# Patient Record
Sex: Female | Born: 1990 | Race: White | Hispanic: No | Smoking: Former smoker
Health system: Southern US, Community
[De-identification: ages and names within clinical notes are randomized; demographics above are authoritative.]

## PROBLEM LIST (undated history)

## (undated) DIAGNOSIS — F419 Anxiety disorder, unspecified: Secondary | ICD-10-CM

## (undated) DIAGNOSIS — D649 Anemia, unspecified: Secondary | ICD-10-CM

## (undated) DIAGNOSIS — F32A Depression, unspecified: Secondary | ICD-10-CM

## (undated) DIAGNOSIS — N809 Endometriosis, unspecified: Secondary | ICD-10-CM

## (undated) HISTORY — DX: Anemia, unspecified: D64.9

## (undated) HISTORY — DX: Anxiety disorder, unspecified: F41.9

## (undated) HISTORY — DX: Depression, unspecified: F32.A

## (undated) HISTORY — DX: Endometriosis, unspecified: N80.9

## (undated) HISTORY — PX: RIGHT OOPHORECTOMY: SHX2359

---

## 2017-05-25 HISTORY — PX: LAPAROSCOPIC HYSTERECTOMY: SHX1926

## 2019-07-07 DIAGNOSIS — N719 Inflammatory disease of uterus, unspecified: Secondary | ICD-10-CM | POA: Insufficient documentation

## 2019-07-07 DIAGNOSIS — F418 Other specified anxiety disorders: Secondary | ICD-10-CM | POA: Insufficient documentation

## 2019-07-07 DIAGNOSIS — D649 Anemia, unspecified: Secondary | ICD-10-CM | POA: Insufficient documentation

## 2019-07-07 DIAGNOSIS — N83209 Unspecified ovarian cyst, unspecified side: Secondary | ICD-10-CM | POA: Insufficient documentation

## 2019-07-07 DIAGNOSIS — Z86010 Personal history of colonic polyps: Secondary | ICD-10-CM | POA: Insufficient documentation

## 2020-09-17 DIAGNOSIS — R109 Unspecified abdominal pain: Secondary | ICD-10-CM | POA: Insufficient documentation

## 2020-09-17 DIAGNOSIS — E781 Pure hyperglyceridemia: Secondary | ICD-10-CM | POA: Insufficient documentation

## 2020-09-17 DIAGNOSIS — Z90710 Acquired absence of both cervix and uterus: Secondary | ICD-10-CM | POA: Insufficient documentation

## 2020-09-17 DIAGNOSIS — E559 Vitamin D deficiency, unspecified: Secondary | ICD-10-CM | POA: Insufficient documentation

## 2021-09-16 ENCOUNTER — Encounter: Payer: Self-pay | Admitting: Nurse Practitioner

## 2021-09-16 ENCOUNTER — Ambulatory Visit (INDEPENDENT_AMBULATORY_CARE_PROVIDER_SITE_OTHER): Payer: 59 | Admitting: Nurse Practitioner

## 2021-09-16 VITALS — BP 122/76 | HR 78 | Temp 98.2°F | Ht 66.0 in | Wt 281.8 lb

## 2021-09-16 DIAGNOSIS — D649 Anemia, unspecified: Secondary | ICD-10-CM | POA: Diagnosis not present

## 2021-09-16 DIAGNOSIS — Z131 Encounter for screening for diabetes mellitus: Secondary | ICD-10-CM

## 2021-09-16 DIAGNOSIS — Z7689 Persons encountering health services in other specified circumstances: Secondary | ICD-10-CM

## 2021-09-16 DIAGNOSIS — F4310 Post-traumatic stress disorder, unspecified: Secondary | ICD-10-CM | POA: Insufficient documentation

## 2021-09-16 DIAGNOSIS — F418 Other specified anxiety disorders: Secondary | ICD-10-CM | POA: Diagnosis not present

## 2021-09-16 DIAGNOSIS — E559 Vitamin D deficiency, unspecified: Secondary | ICD-10-CM

## 2021-09-16 DIAGNOSIS — E781 Pure hyperglyceridemia: Secondary | ICD-10-CM

## 2021-09-16 MED ORDER — BUPROPION HCL ER (XL) 300 MG PO TB24: 300.0000 mg | ORAL_TABLET | Freq: Every day | ORAL | 2 refills | Status: DC

## 2021-09-16 MED ORDER — TRAZODONE HCL 100 MG PO TABS: 100.0000 mg | ORAL_TABLET | Freq: Every day | ORAL | 2 refills | Status: DC

## 2021-09-16 MED ORDER — PRAZOSIN HCL 1 MG PO CAPS: 1.0000 mg | ORAL_CAPSULE | Freq: Every day | ORAL | 2 refills | Status: DC

## 2021-09-16 NOTE — Progress Notes (Signed)
? ?Subjective:  ? ? Patient ID: Patricia Ibarra, female    DOB: 15-Feb-1991, 31 y.o.   MRN: [id number] ? ?HPI ? ?Encounter to establish care ?Patient here to establish care.  Patient recently moved from [place]. ? ?Mixed anxiety and depressive disorder ?Patient states that she has history of anxiety and depression.  Patient currently takes Wellbutrin 300 mg for depression.  Patient states that she is doing well on it and taking it without any difficulties.  ? ?Patient does admit that her emotions have been up and down lately and wants to know if it could possibly be due to her going to menopause.  ? ?Patient denies any thoughts of hurting herself or anyone else.  Patient denies any self injury. ? ?PTSD (post-traumatic stress disorder) ?Patient states that she has history of PTSD.  Patient takes prazosin and trazodone for nightmares and sleep.  Patient states that she takes medication without difficulty. ? ?Anemia, unspecified type ?Patient states that she has history of anemia.  Patient states that some of her anemia has gotten better since her hysterectomy.  Patient states that they last 1 ovary in place. ? ?Patient states that she is currently taking iron and B12 supplements due to history of anemia. ? ?Vitamin D deficiency ?Patient admits to history of vitamin D deficiency and is taking 50,000 units of vitamin D weekly. ? ?Hypertriglyceridemia ?Patient has history of hyperlipidemia. ? ?Review of Systems  ?All other systems reviewed and are negative. ? ?   ?Objective:  ? Physical Exam ?Vitals reviewed.  ?Constitutional:   ?   General: She is not in acute distress. ?   Appearance: Normal appearance. She is obese. She is not ill-appearing, toxic-appearing or diaphoretic.  ?Cardiovascular:  ?   Rate and Rhythm: Normal rate and regular rhythm.  ?   Pulses: Normal pulses.  ?   Heart sounds: Normal heart sounds. No murmur heard. ?Pulmonary:  ?   Effort: Pulmonary effort is normal. No respiratory distress.  ?    Breath sounds: Normal breath sounds. No wheezing.  ?Musculoskeletal:  ?   Cervical back: Normal range of motion and neck supple. No rigidity or tenderness.  ?   Right lower leg: No edema.  ?   Left lower leg: No edema.  ?   Comments: Grossly intact  ?Lymphadenopathy:  ?   Cervical: No cervical adenopathy.  ?Skin: ?   General: Skin is warm.  ?   Capillary Refill: Capillary refill takes less than 2 seconds.  ?Neurological:  ?   Mental Status: She is alert.  ?   Comments: Grossly intact  ?Psychiatric:     ?   Mood and Affect: Mood normal.     ?   Behavior: Behavior normal.  ? ? ? ? ? ?   ?Assessment & Plan:  ? ?1. Encounter to establish care ?-Return to clinic in 3 months for follow-up ? ?2. Mixed anxiety and depressive disorder ?-PHQ-9 score today 12 ?GAD-7 score today is 10 ?-Patient to continue Wellbutrin as prescribed. ?-We will also assess thyroid for any abnormalities which may explain recent changes to mood ?- buPROPion (WELLBUTRIN XL) 300 MG 24 hr tablet; Take 1 tablet (300 mg total) by mouth daily.  Dispense: 30 tablet; Refill: 2 ?- TSH + free T4 ?-Ambulatory referral to psychiatry ?-Return to clinic in 3 months ? ?3. PTSD (post-traumatic stress disorder) ?-Continue taking medication as prescribed. ?- prazosin (MINIPRESS) 1 MG capsule; Take 1 capsule (1 mg total) by mouth at bedtime.  Dispense: 30 capsule; Refill: 2 ?- traZODone (DESYREL) 100 MG tablet; Take 1 tablet (100 mg total) by mouth at bedtime.  Dispense: 30 tablet; Refill: 2 ?- Ambulatory referral to Psychiatry ? ?4. Anemia, unspecified type ?-We will assess patient for the presence of anemia. ?-We will evaluate need for continued use of iron supplements and B12 supplements. ?- B12 and Folate Panel ?- CBC with Differential ?- Fe+TIBC+Fer ? ?5. Vitamin D deficiency ?-We will evaluate necessity of continued vitamin D supplementation. ?- Vitamin D (25 hydroxy) ? ?6. Hypertriglyceridemia ?- Lipid Profile ?- CMP14+EGFR ? ?7. Diabetes mellitus screening ?-  HgB A1c ? ?  ?Note:  This document was prepared using Dragon voice recognition software and may include unintentional dictation errors. ? ?

## 2021-09-17 ENCOUNTER — Telehealth: Payer: Self-pay | Admitting: Nurse Practitioner

## 2021-09-17 ENCOUNTER — Telehealth: Payer: Self-pay | Admitting: Hematology

## 2021-09-17 ENCOUNTER — Other Ambulatory Visit: Payer: Self-pay | Admitting: Nurse Practitioner

## 2021-09-17 DIAGNOSIS — D509 Iron deficiency anemia, unspecified: Secondary | ICD-10-CM

## 2021-09-17 LAB — CBC WITH DIFFERENTIAL/PLATELET
Basophils Absolute: 0 10*3/uL (ref 0.0–0.2)
Basos: 0 %
EOS (ABSOLUTE): 0.2 10*3/uL (ref 0.0–0.4)
Eos: 2 %
Hematocrit: 40.9 % (ref 34.0–46.6)
Hemoglobin: 13.1 g/dL (ref 11.1–15.9)
Immature Grans (Abs): 0 10*3/uL (ref 0.0–0.1)
Immature Granulocytes: 0 %
Lymphocytes Absolute: 2.7 10*3/uL (ref 0.7–3.1)
Lymphs: 24 %
MCH: 26.6 pg (ref 26.6–33.0)
MCHC: 32 g/dL (ref 31.5–35.7)
MCV: 83 fL (ref 79–97)
Monocytes Absolute: 0.5 10*3/uL (ref 0.1–0.9)
Monocytes: 5 %
Neutrophils Absolute: 7.8 10*3/uL — ABNORMAL HIGH (ref 1.4–7.0)
Neutrophils: 69 %
Platelets: 308 10*3/uL (ref 150–450)
RBC: 4.93 x10E6/uL (ref 3.77–5.28)
RDW: 12.4 % (ref 11.7–15.4)
WBC: 11.2 10*3/uL — ABNORMAL HIGH (ref 3.4–10.8)

## 2021-09-17 LAB — TSH+FREE T4
Free T4: 1.41 ng/dL (ref 0.82–1.77)
TSH: 1.84 u[IU]/mL (ref 0.450–4.500)

## 2021-09-17 LAB — CMP14+EGFR
ALT: 39 IU/L — ABNORMAL HIGH (ref 0–32)
AST: 20 IU/L (ref 0–40)
Albumin/Globulin Ratio: 1.9 (ref 1.2–2.2)
Albumin: 4.4 g/dL (ref 3.8–4.8)
Alkaline Phosphatase: 73 IU/L (ref 44–121)
BUN/Creatinine Ratio: 12 (ref 9–23)
BUN: 9 mg/dL (ref 6–20)
Bilirubin Total: 0.2 mg/dL (ref 0.0–1.2)
CO2: 25 mmol/L (ref 20–29)
Calcium: 9.5 mg/dL (ref 8.7–10.2)
Chloride: 104 mmol/L (ref 96–106)
Creatinine, Ser: 0.74 mg/dL (ref 0.57–1.00)
Globulin, Total: 2.3 g/dL (ref 1.5–4.5)
Glucose: 102 mg/dL — ABNORMAL HIGH (ref 70–99)
Potassium: 4.4 mmol/L (ref 3.5–5.2)
Sodium: 144 mmol/L (ref 134–144)
Total Protein: 6.7 g/dL (ref 6.0–8.5)
eGFR: 110 mL/min/{1.73_m2} (ref >59)

## 2021-09-17 LAB — IRON,TIBC AND FERRITIN PANEL
Ferritin: 114 ng/mL (ref 15–150)
Iron Saturation: 9 % — CL (ref 15–55)
Iron: 28 ug/dL (ref 27–159)
Total Iron Binding Capacity: 302 ug/dL (ref 250–450)
UIBC: 274 ug/dL (ref 131–425)

## 2021-09-17 LAB — HEMOGLOBIN A1C
Est. average glucose Bld gHb Est-mCnc: 117 mg/dL
Hgb A1c MFr Bld: 5.7 % — ABNORMAL HIGH (ref 4.8–5.6)

## 2021-09-17 LAB — LIPID PANEL
Chol/HDL Ratio: 3.8 ratio (ref 0.0–4.4)
Cholesterol, Total: 134 mg/dL (ref 100–199)
HDL: 35 mg/dL — ABNORMAL LOW (ref >39)
LDL Chol Calc (NIH): 80 mg/dL (ref 0–99)
Triglycerides: 99 mg/dL (ref 0–149)
VLDL Cholesterol Cal: 19 mg/dL (ref 5–40)

## 2021-09-17 LAB — B12 AND FOLATE PANEL
Folate: 9.6 ng/mL (ref >3.0)
Vitamin B-12: 346 pg/mL (ref 232–1245)

## 2021-09-17 LAB — VITAMIN D 25 HYDROXY (VIT D DEFICIENCY, FRACTURES): Vit D, 25-Hydroxy: 34.4 ng/mL (ref 30.0–100.0)

## 2021-09-17 NOTE — Telephone Encounter (Signed)
Amy from [facility] calling to let us know that they do not have any openings until May 26. Amy states that she will fix the referral for patient to go to [place]. ?

## 2021-09-17 NOTE — Telephone Encounter (Signed)
Scheduled appt per 4/26 referral. I did offer pt an earlier date, however pt was unable to take it due to her schedule. She was scheduled for next appt that worked for her. Pt is aware of appt date and time. Pt is aware to arrive 15 mins prior to appt time and to bring and updated insurance card. Pt is aware of appt location.   ?

## 2021-09-18 ENCOUNTER — Other Ambulatory Visit: Payer: Self-pay | Admitting: Nurse Practitioner

## 2021-09-18 DIAGNOSIS — D509 Iron deficiency anemia, unspecified: Secondary | ICD-10-CM

## 2021-09-18 MED ORDER — IRON (FERROUS SULFATE) 325 (65 FE) MG PO TABS: 325.0000 mg | ORAL_TABLET | Freq: Every day | ORAL | 1 refills | Status: DC

## 2021-09-23 ENCOUNTER — Ambulatory Visit
Admission: EM | Admit: 2021-09-23 | Discharge: 2021-09-23 | Disposition: A | Discharge status: Home / Self Care | Payer: 59 | Attending: Family Medicine | Admitting: Family Medicine

## 2021-09-23 ENCOUNTER — Ambulatory Visit (INDEPENDENT_AMBULATORY_CARE_PROVIDER_SITE_OTHER): Payer: 59

## 2021-09-23 ENCOUNTER — Encounter: Payer: Self-pay | Admitting: Emergency Medicine

## 2021-09-23 DIAGNOSIS — M25531 Pain in right wrist: Secondary | ICD-10-CM

## 2021-09-23 MED ORDER — IBUPROFEN 800 MG PO TABS
800.0000 mg | ORAL_TABLET | Freq: Once | ORAL | Status: AC
  Administered 2021-09-23: 800 mg via ORAL

## 2021-09-23 NOTE — ED Provider Notes (Signed)
?[facility] ? ? ? ?CSN: [id number] ?Arrival date & time: 09/23/21  [id number] ? ? ?  ? ?History   ?Chief Complaint ?No chief complaint on file. ? ? ?HPI ?Patricia Ibarra is a 31 y.o. female.  ? ?Presenting today with new onset right wrist pain extending to the base of the right hand after falling and landing on the wrist.  She states it bent the wrist onto itself when she fell.  Has very painful range of motion though able to move in all directions, numbness and tingling into her middle 3 fingers on the right hand and swelling starting to form.  She states that this just happened this morning.  Has not yet taken anything for pain.  No past history of orthopedic injury to the area. ? ? ?History reviewed. No pertinent past medical history. ? ?Patient Active Problem List  ? Diagnosis Date Noted  ? PTSD (post-traumatic stress disorder) 09/16/2021  ? Morbid obesity (HCC) 09/16/2021  ? Abdominal pain 09/17/2020  ? History of total hysterectomy 09/17/2020  ? Hypertriglyceridemia 09/17/2020  ? Vitamin D deficiency 09/17/2020  ? History of adenomatous polyp of colon 07/07/2019  ? Anemia 07/07/2019  ? Cyst of ovary 07/07/2019  ? Endometritis 07/07/2019  ? Mixed anxiety and depressive disorder 07/07/2019  ? ? ?History reviewed. No pertinent surgical history. ? ?OB History   ?No obstetric history on file. ?  ? ? ? ?Home Medications   ? ?Prior to Admission medications   ?Medication Sig Start Date End Date Taking? Authorizing Provider  ?B Complex-C (B-COMPLEX WITH VITAMIN C) tablet Take 1 tablet by mouth daily.    [provider]  ?buPROPion (WELLBUTRIN XL) 300 MG 24 hr tablet Take 1 tablet (300 mg total) by mouth daily. 09/16/21   Ameduite, Alvino ChapelLeonna S, NP  ?Iron, Ferrous Sulfate, 325 (65 Fe) MG TABS Take 325 mg by mouth daily. 09/18/21   Ameduite, Alvino ChapelLeonna S, NP  ?prazosin (MINIPRESS) 1 MG capsule Take 1 capsule (1 mg total) by mouth at bedtime. 09/16/21   Ameduite, Alvino ChapelLeonna S, NP  ?traZODone (DESYREL) 100 MG tablet Take 1  tablet (100 mg total) by mouth at bedtime. 09/16/21   Ameduite, Alvino ChapelLeonna S, NP  ?Vitamin D, Ergocalciferol, (DRISDOL) 1.25 MG (50000 UNIT) CAPS capsule Take 50,000 Units by mouth every 7 (seven) days.    [provider]  ? ? ?Family History ?History reviewed. No pertinent family history. ? ?Social History ?Social History  ? ?Tobacco Use  ? Smoking status: Former  ?  Packs/day: 0.25  ?  Types: Cigarettes  ?  Quit date: 2022  ?  Years since quitting: 1.3  ?Substance Use Topics  ? Alcohol use: Never  ? Drug use: Never  ? ? ? ?Allergies   ?Morphine ? ? ?Review of Systems ?Review of Systems ?Per HPI ? ?Physical Exam ?Triage Vital Signs ?ED Triage Vitals  ?Enc Vitals Group  ?   BP 09/23/21 0938 117/85  ?   Pulse Rate 09/23/21 0938 88  ?   Resp 09/23/21 0938 18  ?   Temp 09/23/21 0938 98.5 ?F (36.9 ?C)  ?   Temp Source 09/23/21 0938 Oral  ?   SpO2 09/23/21 0938 97 %  ?   Weight --   ?   Height --   ?   Head Circumference --   ?   Peak Flow --   ?   Pain Score 09/23/21 0939 8  ?   Pain Loc --   ?  Pain Edu? --   ?   Excl. in GC? --   ? ?No data found. ? ?Updated Vital Signs ?BP 117/85 (BP Location: Right Arm)   Pulse 88   Temp 98.5 ?F (36.9 ?C) (Oral)   Resp 18   SpO2 97%  ? ?Visual Acuity ?Right Eye Distance:   ?Left Eye Distance:   ?Bilateral Distance:   ? ?Right Eye Near:   ?Left Eye Near:    ?Bilateral Near:    ? ?Physical Exam ?Vitals and nursing note reviewed.  ?Constitutional:   ?   Appearance: Normal appearance. She is not ill-appearing.  ?HENT:  ?   Head: Atraumatic.  ?   Mouth/Throat:  ?   Mouth: Mucous membranes are moist.  ?Eyes:  ?   Extraocular Movements: Extraocular movements intact.  ?   Conjunctiva/sclera: Conjunctivae normal.  ?Cardiovascular:  ?   Rate and Rhythm: Normal rate and regular rhythm.  ?   Heart sounds: Normal heart sounds.  ?Pulmonary:  ?   Effort: Pulmonary effort is normal.  ?   Breath sounds: Normal breath sounds.  ?Musculoskeletal:     ?   General: Swelling, tenderness and signs  of injury present. No deformity. Normal range of motion.  ?   Cervical back: Normal range of motion and neck supple.  ?   Comments: Range of motion guarded but intact right wrist and hand.  Diffuse tenderness to palpation right dorsal wrist into proximal hand.  Trace edema in this area.  No bony deformity palpable  ?Skin: ?   General: Skin is warm and dry.  ?   Findings: No bruising or erythema.  ?Neurological:  ?   Mental Status: She is alert and oriented to person, place, and time.  ?   Motor: No weakness.  ?   Gait: Gait normal.  ?   Comments: Right hand neurovascularly intact  ?Psychiatric:     ?   Mood and Affect: Mood normal.     ?   Thought Content: Thought content normal.     ?   Judgment: Judgment normal.  ? ? ? ?UC Treatments / Results  ?Labs ?(all labs ordered are listed, but only abnormal results are displayed) ?Labs Reviewed - No data to display ? ?EKG ? ? ?Radiology ?DG Wrist Complete Right ? ?Result Date: 09/23/2021 ?CLINICAL DATA:  Injured wrist this morning.  Pain. EXAM: RIGHT WRIST - COMPLETE 3+ VIEW COMPARISON:  None FINDINGS: The joint spaces are maintained. No acute fracture. IMPRESSION: No acute bony findings. Electronically Signed   By: Rudie Meyer M.D.   On: 09/23/2021 09:57   ? ?Procedures ?Procedures (including critical care time) ? ?Medications Ordered in UC ?Medications  ?ibuprofen (ADVIL) tablet 800 mg (800 mg Oral Given 09/23/21 0946)  ? ? ?Initial Impression / Assessment and Plan / UC Course  ?I have reviewed the triage vital signs and the nursing notes. ? ?Pertinent labs & imaging results that were available during my care of the patient were reviewed by me and considered in my medical decision making (see chart for details). ? ?  ? ? ?X-ray of the right wrist negative for acute bony abnormality, suspect strain from the fall.  Will place in a wrist brace for comfort, discussed RICE protocol, over-the-counter pain relievers.  Ibuprofen given in clinic per her request.  Return for any  acutely worsening symptoms. ? ?Final Clinical Impressions(s) / UC Diagnoses  ? ?Final diagnoses:  ?Right wrist pain  ? ?Discharge Instructions   ?  None ?  ? ?ED Prescriptions   ?None ?  ? ?PDMP not reviewed this encounter. ?  ?Particia Nearing, PA-C ?09/23/21 1024 ? ?

## 2021-09-23 NOTE — ED Triage Notes (Signed)
Rolled over right arm and wrist was bend inward this morning.   ?

## 2021-10-06 ENCOUNTER — Other Ambulatory Visit: Payer: Self-pay

## 2021-10-06 ENCOUNTER — Inpatient Hospital Stay: Payer: 59 | Attending: Hematology | Admitting: Hematology

## 2021-10-06 ENCOUNTER — Inpatient Hospital Stay: Payer: 59

## 2021-10-06 VITALS — BP 119/68 | HR 77 | Temp 97.7°F | Resp 20 | Wt 273.8 lb

## 2021-10-06 DIAGNOSIS — Z87891 Personal history of nicotine dependence: Secondary | ICD-10-CM | POA: Diagnosis not present

## 2021-10-06 DIAGNOSIS — D509 Iron deficiency anemia, unspecified: Secondary | ICD-10-CM

## 2021-10-06 DIAGNOSIS — Z79899 Other long term (current) drug therapy: Secondary | ICD-10-CM | POA: Insufficient documentation

## 2021-10-06 DIAGNOSIS — N809 Endometriosis, unspecified: Secondary | ICD-10-CM | POA: Insufficient documentation

## 2021-10-06 DIAGNOSIS — R5383 Other fatigue: Secondary | ICD-10-CM | POA: Insufficient documentation

## 2021-10-06 DIAGNOSIS — K580 Irritable bowel syndrome with diarrhea: Secondary | ICD-10-CM | POA: Diagnosis not present

## 2021-10-06 DIAGNOSIS — F32A Depression, unspecified: Secondary | ICD-10-CM | POA: Insufficient documentation

## 2021-10-06 DIAGNOSIS — R519 Headache, unspecified: Secondary | ICD-10-CM | POA: Insufficient documentation

## 2021-10-06 LAB — CBC WITH DIFFERENTIAL/PLATELET
Abs Immature Granulocytes: 0.05 10*3/uL (ref 0.00–0.07)
Basophils Absolute: 0 10*3/uL (ref 0.0–0.1)
Basophils Relative: 0 %
Eosinophils Absolute: 0.3 10*3/uL (ref 0.0–0.5)
Eosinophils Relative: 3 %
HCT: 38.8 % (ref 36.0–46.0)
Hemoglobin: 12.8 g/dL (ref 12.0–15.0)
Immature Granulocytes: 1 %
Lymphocytes Relative: 26 %
Lymphs Abs: 2.7 10*3/uL (ref 0.7–4.0)
MCH: 27 pg (ref 26.0–34.0)
MCHC: 33 g/dL (ref 30.0–36.0)
MCV: 81.9 fL (ref 80.0–100.0)
Monocytes Absolute: 0.5 10*3/uL (ref 0.1–1.0)
Monocytes Relative: 5 %
Neutro Abs: 6.9 10*3/uL (ref 1.7–7.7)
Neutrophils Relative %: 65 %
Platelets: 268 10*3/uL (ref 150–400)
RBC: 4.74 MIL/uL (ref 3.87–5.11)
RDW: 12.7 % (ref 11.5–15.5)
WBC: 10.3 10*3/uL (ref 4.0–10.5)
nRBC: 0 % (ref 0.0–0.2)

## 2021-10-06 LAB — SEDIMENTATION RATE: Sed Rate: 12 mm/hr (ref 0–22)

## 2021-10-06 LAB — CMP (CANCER CENTER ONLY)
ALT: 43 U/L (ref 0–44)
AST: 21 U/L (ref 15–41)
Albumin: 4.4 g/dL (ref 3.5–5.0)
Alkaline Phosphatase: 54 U/L (ref 38–126)
Anion gap: 7 (ref 5–15)
BUN: 10 mg/dL (ref 6–20)
CO2: 27 mmol/L (ref 22–32)
Calcium: 9.3 mg/dL (ref 8.9–10.3)
Chloride: 103 mmol/L (ref 98–111)
Creatinine: 0.66 mg/dL (ref 0.44–1.00)
GFR, Estimated: >60 mL/min (ref >60)
Glucose, Bld: 98 mg/dL (ref 70–99)
Potassium: 4 mmol/L (ref 3.5–5.1)
Sodium: 137 mmol/L (ref 135–145)
Total Bilirubin: 0.5 mg/dL (ref 0.3–1.2)
Total Protein: 7.2 g/dL (ref 6.5–8.1)

## 2021-10-06 LAB — IRON AND IRON BINDING CAPACITY (CC-WL,HP ONLY)
Iron: 43 ug/dL (ref 28–170)
Saturation Ratios: 13 % (ref 10.4–31.8)
TIBC: 343 ug/dL (ref 250–450)
UIBC: 300 ug/dL (ref 148–442)

## 2021-10-06 NOTE — Progress Notes (Signed)
HEMATOLOGY/ONCOLOGY CONSULTATION NOTE  Date of Service: 10/06/2021  Patient Care Team: Ameduite, Alvino ChapelLeonna S, NP as PCP - General (Nurse Practitioner)  CHIEF COMPLAINTS/PURPOSE OF CONSULTATION:  Evaluation and management of iron deficiency anemia.  HISTORY OF PRESENTING ILLNESS:   Patricia Ibarra is a wonderful 31 y.o. female who has been referred to us by Dr Thayer JewAmeduite, Alvino ChapelLeonna S, NP for evaluation and management of iron deficiency. She reports She is doing well.  She notes that she has dealt with this previously due to heavier periods. She reports having endometriosis.   She takes vitamin B12 and vitamin D.  She notes having some fatigue.  She notes diarrhea dominant IBS.  She reports some recent night sweats. She notes that she feels some of her symptoms are attributed to depression or her weight.  She notes some headaches that radiate from her neck to her head.  We discussed that based on her labs that IV iron would not be advisable. She notes that she tolerates the oral iron well and we discussed continuing oral iron. We discussed getting labs today for further evaluation which she was agreeable to.  She reports persistent fatigue. She endorses Wellbutrin. She reports that the oral iron does not improve her fatigue much.   No black or bloody stools. No other new or acute focal symptoms.  Labs done 09/16/2021 were reviewed in detail.  CBC WNL except slightly elevated ANC of 7.8 and WBC count of 11.2 Ferritin of 114. Iron Saturation of 9% CMP unremarkable.  MEDICAL HISTORY:  H/o depression with previous suicidal ideation needing hospitalization.  SURGICAL HISTORY: cholecystectomy  SOCIAL HISTORY: Social History   Socioeconomic History   Marital status: Married    Spouse name: Not on file   Number of children: Not on file   Years of education: Not on file   Highest education level: Not on file  Occupational History   Not on file  Tobacco Use   Smoking  status: Former    Packs/day: 0.25    Types: Cigarettes    Quit date: 2022    Years since quitting: 1.3   Smokeless tobacco: Not on file  Substance and Sexual Activity   Alcohol use: Never   Drug use: Never   Sexual activity: Not on file  Other Topics Concern   Not on file  Social History Narrative   Not on file   Social Determinants of Health   Financial Resource Strain: Not on file  Food Insecurity: Not on file  Transportation Needs: Not on file  Physical Activity: Not on file  Stress: Not on file  Social Connections: Not on file  Intimate Partner Violence: Not on file    FAMILY HISTORY: No family history on file.  ALLERGIES:  is allergic to morphine.  MEDICATIONS:  Current Outpatient Medications  Medication Sig Dispense Refill   B Complex-C (B-COMPLEX WITH VITAMIN C) tablet Take 1 tablet by mouth daily.     buPROPion (WELLBUTRIN XL) 300 MG 24 hr tablet Take 1 tablet (300 mg total) by mouth daily. 30 tablet 2   Iron, Ferrous Sulfate, 325 (65 Fe) MG TABS Take 325 mg by mouth daily. 90 tablet 1   prazosin (MINIPRESS) 1 MG capsule Take 1 capsule (1 mg total) by mouth at bedtime. 30 capsule 2   traZODone (DESYREL) 100 MG tablet Take 1 tablet (100 mg total) by mouth at bedtime. 30 tablet 2   Vitamin D, Ergocalciferol, (DRISDOL) 1.25 MG (50000 UNIT) CAPS capsule Take 50,000 Units  by mouth every 7 (seven) days.     No current facility-administered medications for this visit.    REVIEW OF SYSTEMS:    10 Point review of Systems was done is negative except as noted above.  PHYSICAL EXAMINATION: ECOG PERFORMANCE STATUS: 0 - Asymptomatic  . Vitals:   10/06/21 1430  BP: 119/68  Pulse: 77  Resp: 20  Temp: 97.7 F (36.5 C)  SpO2: 99%   Filed Weights   10/06/21 1430  Weight: 273 lb 12.8 oz (124.2 kg)   .There is no height or weight on file to calculate BMI. NAD GENERAL:alert, in no acute distress and comfortable SKIN: no acute rashes, no significant lesions EYES:  conjunctiva are pink and non-injected, sclera anicteric NECK: supple, no JVD LYMPH:  no palpable lymphadenopathy in the cervical, axillary or inguinal regions LUNGS: clear to auscultation b/l with normal respiratory effort HEART: regular rate & rhythm ABDOMEN:  normoactive bowel sounds , non tender, not distended. Extremity: no pedal edema PSYCH: alert & oriented x 3 with fluent speech NEURO: no focal motor/sensory deficits  LABORATORY DATA:  I have reviewed the data as listed .    Latest Ref Rng & Units 10/06/2021    3:01 PM 09/16/2021    9:26 AM  CBC  WBC 4.0 - 10.5 K/uL 10.3   11.2    Hemoglobin 12.0 - 15.0 g/dL 67.3   41.9    Hematocrit 36.0 - 46.0 % 38.8   40.9    Platelets 150 - 400 K/uL 268   308     .    Latest Ref Rng & Units 10/06/2021    3:01 PM 09/16/2021    9:26 AM  CMP  Glucose 70 - 99 mg/dL 98   379    BUN 6 - 20 mg/dL 10   9    Creatinine 0.24 - 1.00 mg/dL 0.97   3.53    Sodium 299 - 145 mmol/L 137   144    Potassium 3.5 - 5.1 mmol/L 4.0   4.4    Chloride 98 - 111 mmol/L 103   104    CO2 22 - 32 mmol/L 27   25    Calcium 8.9 - 10.3 mg/dL 9.3   9.5    Total Protein 6.5 - 8.1 g/dL 7.2   6.7    Total Bilirubin 0.3 - 1.2 mg/dL 0.5   0.2    Alkaline Phos 38 - 126 U/L 54   73    AST 15 - 41 U/L 21   20    ALT 0 - 44 U/L 43   39     . Lab Results  Component Value Date   IRON 43 10/06/2021   TIBC 343 10/06/2021   IRONPCTSAT 13 10/06/2021   (Iron and TIBC)  Lab Results  Component Value Date   FERRITIN 73 10/06/2021        RADIOGRAPHIC STUDIES: I have personally reviewed the radiological images as listed and agreed with the findings in the report. DG Wrist Complete Right  Result Date: 09/23/2021 CLINICAL DATA:  Injured wrist this morning.  Pain. EXAM: RIGHT WRIST - COMPLETE 3+ VIEW COMPARISON:  None FINDINGS: The joint spaces are maintained. No acute fracture. IMPRESSION: No acute bony findings. Electronically Signed   By: Rudie Meyer M.D.   On:  09/23/2021 09:57     ASSESSMENT & PLAN:   31 y.o. very pleasant female with   Iron deficiency anemia Labs done 09/16/2021 were reviewed in detail.  Ferritin of 114. Iron Saturation of 9%  Plan -I discussed available labs with the patient in details -Patient's chart reviewed in detail -Continue 1x Ferrous Sulfate 325 mg tablet p.o daily and if tolerated increase to 1 tab po bid. Alternatively could use Iron polysaccharide 150mg  po daily OTC. -Schedule labs today. -Return to clinic as needed. (F/u with PCP in 3 months for rpt labs and adjust po iron to optimize iron levels ferritin>50% and iron sat>20%). - if patient continue to get anemia -- plz reconsult for consideration of IV iron replacement. Follow up: Labs today RTC with Dr as needed    .The total time spent in the appointment was 35 minutes* .  All of the patient's questions were answered with apparent satisfaction. The patient knows to call the clinic with any problems, questions or concerns.   Candise Che MD MS AAHIVMS [facility] Hematology/Oncology Physician [facility]  .*Total Encounter Time as defined by the Centers for Medicare and Medicaid Services includes, in addition to the face-to-face time of a patient visit (documented in the note above) non-face-to-face time: obtaining and reviewing outside history, ordering and reviewing medications, tests or procedures, care coordination (communications with other health care professionals or caregivers) and documentation in the medical record.  I, [facility], am acting as scribe for Bernestine Amass, MD. .I have reviewed the above documentation for accuracy and completeness, and I agree with the above. Sheron Nightingale MD

## 2021-10-07 LAB — FERRITIN: Ferritin: 73 ng/mL (ref 11–307)

## 2021-10-08 ENCOUNTER — Other Ambulatory Visit: Payer: Self-pay

## 2021-10-08 ENCOUNTER — Other Ambulatory Visit: Payer: Self-pay | Admitting: Nurse Practitioner

## 2021-10-08 ENCOUNTER — Encounter: Payer: Self-pay | Admitting: Nurse Practitioner

## 2021-10-08 DIAGNOSIS — E559 Vitamin D deficiency, unspecified: Secondary | ICD-10-CM

## 2021-10-08 MED ORDER — VITAMIN D (ERGOCALCIFEROL) 1.25 MG (50000 UNIT) PO CAPS
50000.0000 [IU] | ORAL_CAPSULE | ORAL | 0 refills | Status: DC
Start: 2021-10-08 — End: 2022-08-13
  Filled 2021-10-08: qty 6, 42d supply, fill #0

## 2021-10-14 ENCOUNTER — Other Ambulatory Visit: Payer: Self-pay

## 2021-10-14 ENCOUNTER — Ambulatory Visit
Admission: EM | Admit: 2021-10-14 | Discharge: 2021-10-14 | Disposition: A | Discharge status: Home / Self Care | Payer: 59 | Attending: Nurse Practitioner | Admitting: Nurse Practitioner

## 2021-10-14 ENCOUNTER — Encounter: Payer: Self-pay | Admitting: Emergency Medicine

## 2021-10-14 DIAGNOSIS — S80862A Insect bite (nonvenomous), left lower leg, initial encounter: Secondary | ICD-10-CM

## 2021-10-14 DIAGNOSIS — W57XXXA Bitten or stung by nonvenomous insect and other nonvenomous arthropods, initial encounter: Secondary | ICD-10-CM

## 2021-10-14 MED ORDER — DOXYCYCLINE HYCLATE 100 MG PO TABS: 200.0000 mg | ORAL_TABLET | Freq: Once | ORAL | 0 refills | Status: AC

## 2021-10-14 NOTE — ED Provider Notes (Signed)
[facility]    CSN: [id number] Arrival date & time: 10/14/21  0805      History   Chief Complaint Chief Complaint  Patient presents with   Tick Removal    HPI Patricia Ibarra is a 31 y.o. female.   The patient is a 31 year old female who presents after she removed a tick last evening.  Tick was attached to her left lower extremity.  She states [place] and she noticed the tick last evening.  Patient does have the tick present with her.  The tick looks intact.  The patient denies fever, chills, generalized malaise, joint pain, or headache.  She states that she has placed an area around the back of her leg where the tick was to ensure the area has not spread or gotten larger.  The history is provided by the patient.   History reviewed. No pertinent past medical history.  Patient Active Problem List   Diagnosis Date Noted   PTSD (post-traumatic stress disorder) 09/16/2021   Morbid obesity (HCC) 09/16/2021   Abdominal pain 09/17/2020   History of total hysterectomy 09/17/2020   Hypertriglyceridemia 09/17/2020   Vitamin D deficiency 09/17/2020   History of adenomatous polyp of colon 07/07/2019   Anemia 07/07/2019   Cyst of ovary 07/07/2019   Endometritis 07/07/2019   Mixed anxiety and depressive disorder 07/07/2019    History reviewed. No pertinent surgical history.  OB History   No obstetric history on file.      Home Medications    Prior to Admission medications   Medication Sig Start Date End Date Taking? Authorizing Provider  doxycycline (VIBRA-TABS) 100 MG tablet Take 2 tablets (200 mg total) by mouth once for 1 dose. 10/14/21 10/14/21 Yes Granvel Proudfoot-Warren, Sadie Haberhristie J, NP  B Complex-C (B-COMPLEX WITH VITAMIN C) tablet Take 1 tablet by mouth daily.    [provider]  buPROPion (WELLBUTRIN XL) 300 MG 24 hr tablet Take 1 tablet (300 mg total) by mouth daily. 09/16/21   [facility], Alvino ChapelLeonna S, NP  Iron,  Ferrous Sulfate, 325 (65 Fe) MG TABS Take 325 mg by mouth daily. 09/18/21   [facility], Alvino ChapelLeonna S, NP  prazosin (MINIPRESS) 1 MG capsule Take 1 capsule (1 mg total) by mouth at bedtime. 09/16/21   [facility], Alvino ChapelLeonna S, NP  traZODone (DESYREL) 100 MG tablet Take 1 tablet (100 mg total) by mouth at bedtime. 09/16/21   [facility], Alvino ChapelLeonna S, NP  Vitamin D, Ergocalciferol, (DRISDOL) 1.25 MG (50000 UNIT) CAPS capsule Take 1 capsule (50,000 Units total) by mouth every 7 (seven) days. 10/08/21   [facility], Alvino ChapelLeonna S, NP    Family History History reviewed. No pertinent family history.  Social History Social History   Tobacco Use   Smoking status: Former    Packs/day: 0.25    Types: Cigarettes    Quit date: 2022    Years since quitting: 1.3  Substance Use Topics   Alcohol use: Never   Drug use: Never     Allergies   Morphine   Review of Systems Review of Systems PER HPI  Physical Exam Triage Vital Signs ED Triage Vitals  Enc Vitals Group     BP 10/14/21 0819 117/78     Pulse Rate 10/14/21 0819 84     Resp 10/14/21 0819 20     Temp 10/14/21 0819 97.7 F (36.5 C)     Temp Source 10/14/21 0819 Oral     SpO2 10/14/21 0819  97 %     Weight 10/14/21 0822 275 lb (124.7 kg)     Height 10/14/21 0822 5\' 7"  (1.702 m)     Head Circumference --      Peak Flow --      Pain Score 10/14/21 0822 3     Pain Loc --      Pain Edu? --      Excl. in GC? --    No data found.  Updated Vital Signs BP 117/78 (BP Location: Right Arm)   Pulse 84   Temp 97.7 F (36.5 C) (Oral)   Resp 20   Ht 5\' 7"  (1.702 m)   Wt 275 lb (124.7 kg)   SpO2 97%   BMI 43.07 kg/m   Visual Acuity Right Eye Distance:   Left Eye Distance:   Bilateral Distance:    Right Eye Near:   Left Eye Near:    Bilateral Near:     Physical Exam Vitals reviewed.  Constitutional:      Appearance: Normal appearance.  HENT:     Head: Normocephalic and atraumatic.  Cardiovascular:     Rate and Rhythm: Normal rate and regular  rhythm.     Pulses: Normal pulses.     Heart sounds: Normal heart sounds.  Pulmonary:     Effort: Pulmonary effort is normal.     Breath sounds: Normal breath sounds.  Abdominal:     General: Bowel sounds are normal.     Palpations: Abdomen is soft.  Skin:    General: Skin is warm and dry.     Capillary Refill: Capillary refill takes less than 2 seconds.     Findings: No bruising, erythema, rash or wound. Rash is not urticarial.  Neurological:     General: No focal deficit present.     Mental Status: She is alert and oriented to person, place, and time.  Psychiatric:        Mood and Affect: Mood normal.        Behavior: Behavior normal.     UC Treatments / Results  Labs (all labs ordered are listed, but only abnormal results are displayed) Labs Reviewed - No data to display  EKG   Radiology No results found.  Procedures Procedures (including critical care time)  Medications Ordered in UC Medications - No data to display  Initial Impression / Assessment and Plan / UC Course  I have reviewed the triage vital signs and the nursing notes.  Pertinent labs & imaging results that were available during my care of the patient were reviewed by me and considered in my medical decision making (see chart for details).  The patient is a 31 year old female who presents after a tick bite to the right lower leg.  Patient states tick was removed approximately 1 day ago. Patient expects tick was [place] for [place]  Patient does have the tick present with her which is intact.  The area on her left lower extremity is intact, there is no rash, redness, or induration where the tick was present.  We will provide the patient with a prescription for doxycycline 200 mg one-time dose.  Discussion with the patient regarding erythema migrans, was able to pull up the pictures on her cell phone to show her what to look for for the next 1 to 2 months.  Strict return precautions  were provided of when to return to our clinic versus when to go to the ER.  Follow-up  as needed. Final Clinical Impressions(s) / UC Diagnoses   Final diagnoses:  Tick bite of left lower leg, initial encounter     Discharge Instructions      Take medication as prescribed. As discussed, continue to monitor the area for a bull's-eye appearing rash.  You should continue to monitor the area for the next 1 to 2 months. You may continue to apply Neosporin to the area as needed. Follow-up in the ED immediately if he develops fever, chills, generalized fatigue, or other concerns. Follow-up as needed.     ED Prescriptions     Medication Sig Dispense Auth. Provider   doxycycline (VIBRA-TABS) 100 MG tablet Take 2 tablets (200 mg total) by mouth once for 1 dose. 2 tablet Petronella Shuford-Warren, Sadie Haber, NP      PDMP not reviewed this encounter.   Abran Cantor, NP 10/14/21 [phone]

## 2021-10-14 NOTE — Discharge Instructions (Addendum)
Take medication as prescribed. As discussed, continue to monitor the area for a bull's-eye appearing rash.  You should continue to monitor the area for the next 1 to 2 months. You may continue to apply Neosporin to the area as needed. Follow-up in the ED immediately if he develops fever, chills, generalized fatigue, or other concerns. Follow-up as needed. 

## 2021-10-14 NOTE — ED Triage Notes (Addendum)
Pt reports removed tick yesterday from left posterior thigh and wants to make sure "also removed head." Pt reports drew around site with a red and black marker to make sure "redness wasn't spreading overtime". Pt reports tenderness to left thigh.

## 2021-11-03 ENCOUNTER — Ambulatory Visit: Payer: 59 | Admitting: Nurse Practitioner

## 2021-12-22 ENCOUNTER — Ambulatory Visit (INDEPENDENT_AMBULATORY_CARE_PROVIDER_SITE_OTHER): Payer: 59 | Admitting: Nurse Practitioner

## 2021-12-22 ENCOUNTER — Encounter: Payer: Self-pay | Admitting: Nurse Practitioner

## 2021-12-22 VITALS — BP 109/76 | HR 65 | Ht 66.0 in | Wt 268.6 lb

## 2021-12-22 DIAGNOSIS — F418 Other specified anxiety disorders: Secondary | ICD-10-CM | POA: Diagnosis not present

## 2021-12-22 DIAGNOSIS — F4310 Post-traumatic stress disorder, unspecified: Secondary | ICD-10-CM

## 2021-12-22 DIAGNOSIS — J302 Other seasonal allergic rhinitis: Secondary | ICD-10-CM | POA: Diagnosis not present

## 2021-12-22 DIAGNOSIS — D509 Iron deficiency anemia, unspecified: Secondary | ICD-10-CM

## 2021-12-22 MED ORDER — CETIRIZINE HCL 10 MG PO TABS: 10.0000 mg | ORAL_TABLET | Freq: Every day | ORAL | 5 refills | Status: DC

## 2021-12-22 MED ORDER — BUPROPION HCL ER (XL) 300 MG PO TB24: 300.0000 mg | ORAL_TABLET | Freq: Every day | ORAL | 5 refills | Status: DC

## 2021-12-22 MED ORDER — IRON (FERROUS SULFATE) 325 (65 FE) MG PO TABS: 325.0000 mg | ORAL_TABLET | Freq: Every day | ORAL | 1 refills | Status: DC

## 2021-12-22 MED ORDER — PRAZOSIN HCL 1 MG PO CAPS: 1.0000 mg | ORAL_CAPSULE | Freq: Every day | ORAL | 5 refills | Status: DC

## 2021-12-22 MED ORDER — TRAZODONE HCL 100 MG PO TABS: 100.0000 mg | ORAL_TABLET | Freq: Every day | ORAL | 5 refills | Status: DC

## 2021-12-22 NOTE — Progress Notes (Signed)
Subjective:    Patient ID: Patricia Ibarra, female    DOB: [birth year], 31 y.o.   MRN: [id number]  HPI  Patient arrives for a follow up on depression and anxiety. Patient states she has no problems or concerns.  Patient was seen by hematology for low iron saturation.  Patient states that she was instructed to continue taking p.o. iron.  Patient states that depression and anxiety has been well controlled with Wellbutrin 300 mg.  Patient would like to continue.  Patient denies any thoughts of wanting to hurt self or anyone else.  Patient continues to take prazosin and trazodone for PTSD.  Patient states that symptoms are currently stable on her prescribed medications.  Patient states that she had multiple untreated ear infections when she was little.  Patient states that her left ear has been bothering her for the past couple weeks.  Patient states that it feels like she is constantly listening to an seashell.  Patient would like her left ear to be evaluated today.  Patient states that she is very excited about her upcoming job working in [employer].  Patient states that she will not have medical insurance for the next 90 days as her benefits are transferred.  Review of Systems  All other systems reviewed and are negative.      Objective:   Physical Exam Vitals reviewed.  Constitutional:      General: She is not in acute distress.    Appearance: Normal appearance. She is normal weight. She is not ill-appearing, toxic-appearing or diaphoretic.  HENT:     Head: Normocephalic and atraumatic.     Right Ear: Hearing, ear canal and external ear normal. No decreased hearing noted. No drainage, swelling or tenderness. No middle ear effusion. There is no impacted cerumen. Tympanic membrane is scarred. Tympanic membrane is not perforated, erythematous, retracted or bulging.     Left Ear: Hearing, ear canal and external ear normal. No decreased hearing noted. No drainage, swelling or  tenderness.  No middle ear effusion. There is no impacted cerumen. Tympanic membrane is scarred and perforated. Tympanic membrane is not erythematous, retracted or bulging.     Ears:     Comments: Scarring noted to bilateral tympanic membranes.  Healed perforation noted to left tympanic membrane.  No erythema, retraction, bulging, discharge noted to bilateral tympanic membranes. Cardiovascular:     Rate and Rhythm: Normal rate and regular rhythm.     Pulses: Normal pulses.     Heart sounds: Normal heart sounds. No murmur heard. Pulmonary:     Effort: Pulmonary effort is normal. No respiratory distress.     Breath sounds: Normal breath sounds. No wheezing.  Musculoskeletal:     Comments: Grossly intact  Skin:    General: Skin is warm.     Capillary Refill: Capillary refill takes less than 2 seconds.  Neurological:     Mental Status: She is alert.     Comments: Grossly intact  Psychiatric:        Mood and Affect: Mood normal.        Behavior: Behavior normal.        Assessment & Plan:   1. Mixed anxiety and depressive disorder -Currently stable -Continue to take Wellbutrin as prescribed - buPROPion (WELLBUTRIN XL) 300 MG 24 hr tablet; Take 1 tablet (300 mg total) by mouth daily.  Dispense: 30 tablet; Refill: 5 -Follow-up in 3 months  2. Iron deficiency anemia, unspecified iron deficiency anemia type -Patient states that she is  unable to pay for lab work due to outstanding previous lab work pills and transitioning between jobs. -We will plan to get iron study lab work done in 3 months - Iron, Ferrous Sulfate, 325 (65 Fe) MG TABS; Take 325 mg by mouth daily.  Dispense: 90 tablet; Refill: 1 -Continue taking iron pills as prescribed -Follow-up in 3 months  3. PTSD (post-traumatic stress disorder) -Currently stable - prazosin (MINIPRESS) 1 MG capsule; Take 1 capsule (1 mg total) by mouth at bedtime.  Dispense: 30 capsule; Refill: 5 - traZODone (DESYREL) 100 MG tablet; Take 1  tablet (100 mg total) by mouth at bedtime.  Dispense: 30 tablet; Refill: 5 -Follow-up in 3 months  4. Seasonal allergies -Sensation to left ear likely allergy related. -We will prescribe Zyrtec for symptom relief. - cetirizine (ZYRTEC ALLERGY) 10 MG tablet; Take 1 tablet (10 mg total) by mouth daily.  Dispense: 30 tablet; Refill: 5 -Return to clinic if hearing changes.  You experience pain to either one of your ears.  Or experience discharge coming from your ears. -Referral to ENT offered however patient states that she would rather wait for referral since she is in between jobs. -Return to clinic 3 months  We will plan to get following lab work in 3 months: -Vitamin D (patient states she is no longer taking vitamin D.  Last vitamin D 34.4.  We will plan to get vitamin D prior to winter season) -Iron study (last iron saturation 13%) -A1c (last A1c 5.7) -CMP -CBC -Lipid panel    Note:  This document was prepared using Dragon voice recognition software and may include unintentional dictation errors. Note - This record has been created using [employer].  Chart creation errors have been sought, but may not always  have been located. Such creation errors do not reflect on  the standard of medical care.

## 2022-03-31 ENCOUNTER — Ambulatory Visit: Payer: BC Managed Care – PPO | Admitting: Nurse Practitioner

## 2022-03-31 ENCOUNTER — Ambulatory Visit (HOSPITAL_COMMUNITY)
Admission: RE | Admit: 2022-03-31 | Discharge: 2022-03-31 | Disposition: A | Discharge status: Home / Self Care | Payer: BC Managed Care – PPO | Source: Ambulatory Visit | Attending: Nurse Practitioner | Admitting: Nurse Practitioner

## 2022-03-31 ENCOUNTER — Other Ambulatory Visit: Payer: Self-pay | Admitting: Nurse Practitioner

## 2022-03-31 VITALS — BP 118/72 | Ht 66.0 in | Wt 274.6 lb

## 2022-03-31 DIAGNOSIS — S99911S Unspecified injury of right ankle, sequela: Secondary | ICD-10-CM | POA: Insufficient documentation

## 2022-03-31 DIAGNOSIS — M25571 Pain in right ankle and joints of right foot: Secondary | ICD-10-CM

## 2022-03-31 MED ORDER — NAPROXEN 500 MG PO TABS: 500.0000 mg | ORAL_TABLET | Freq: Two times a day (BID) | ORAL | 1 refills | Status: DC

## 2022-03-31 NOTE — Progress Notes (Unsigned)
   Subjective:    Patient ID: Patricia Ibarra, female    DOB: 04/29/1991, 31 y.o.   MRN: [id number]  HPI  Patient arrives wit right ankle pain s/p fall September 1st. Patient states the ankle is still swollen and painful and hurts to walk on it.  Review of Systems     Objective:   Physical Exam        Assessment & Plan:

## 2022-04-02 ENCOUNTER — Encounter: Payer: Self-pay | Admitting: Orthopedic Surgery

## 2022-04-02 ENCOUNTER — Encounter: Payer: Self-pay | Admitting: Nurse Practitioner

## 2022-04-07 ENCOUNTER — Ambulatory Visit: Payer: BC Managed Care – PPO | Admitting: Orthopaedic Surgery

## 2022-04-12 ENCOUNTER — Ambulatory Visit
Admission: RE | Admit: 2022-04-12 | Discharge: 2022-04-12 | Disposition: A | Discharge status: Home / Self Care | Payer: BC Managed Care – PPO | Source: Ambulatory Visit | Attending: Nurse Practitioner | Admitting: Nurse Practitioner

## 2022-04-15 ENCOUNTER — Encounter: Payer: Self-pay | Admitting: Orthopaedic Surgery

## 2022-04-15 ENCOUNTER — Ambulatory Visit (INDEPENDENT_AMBULATORY_CARE_PROVIDER_SITE_OTHER): Payer: BC Managed Care – PPO | Admitting: Orthopaedic Surgery

## 2022-04-15 VITALS — BP 140/90 | HR 84 | Ht 68.0 in | Wt 272.4 lb

## 2022-04-15 DIAGNOSIS — M25571 Pain in right ankle and joints of right foot: Secondary | ICD-10-CM | POA: Diagnosis not present

## 2022-04-15 DIAGNOSIS — G8929 Other chronic pain: Secondary | ICD-10-CM | POA: Diagnosis not present

## 2022-04-15 NOTE — Progress Notes (Signed)
Subjective:    Patient ID: Patricia Ibarra, female    DOB: [birth year], 31 y.o.   MRN: [id number]  HPI She injured her right ankle when she tripped over her dog on January 23, 2022.  She had swelling and pain of the ankle. She has treated it with rest, elevation and ibuprofen.  The ankle still has been painful and swelling at times.  It hurts more after being up on it and walking a lot.  She went to [facility] on 04-05-22.  X-rays were done of the ankle and were negative.  She was begun on Naprosyn.  An MRI was done of the right ankle on 04-12-22 showing: IMPRESSION: 1. No internal derangement of the right ankle. 2. Small focal chondral fissure along the anteromedial aspect of the tibial plafond. 3. Nonspecific subcutaneous edema over the dorso lateral aspect of the midfoot.  She was referred here. She has an Database administrator on. She still has ankle pain, and it has been over 2 1/2 months since injury.  She has no other joint pains.  She has no redness, no numbness.     Review of Systems  Constitutional:  Positive for activity change.  Musculoskeletal:  Positive for arthralgias, gait problem and joint swelling.  All other systems reviewed and are negative. For Review of Systems, all other systems reviewed and are negative.  The following is a summary of the past history medically, past history surgically, known current medicines, social history and family history.  This information is gathered electronically by the computer from prior information and documentation.  I review this each visit and have found including this information at this point in the chart is beneficial and informative.   No past medical history on file.  No past surgical history on file.  Current Outpatient Medications on File Prior to Visit  Medication Sig Dispense Refill   B Complex-C (B-COMPLEX WITH VITAMIN C) tablet Take 1 tablet by mouth daily.     buPROPion (WELLBUTRIN XL) 300 MG 24 hr tablet Take 1  tablet (300 mg total) by mouth daily. 30 tablet 5   cetirizine (ZYRTEC ALLERGY) 10 MG tablet Take 1 tablet (10 mg total) by mouth daily. 30 tablet 5   Iron, Ferrous Sulfate, 325 (65 Fe) MG TABS Take 325 mg by mouth daily. 90 tablet 1   naproxen (NAPROSYN) 500 MG tablet Take 1 tablet (500 mg total) by mouth 2 (two) times daily with a meal. 60 tablet 1   prazosin (MINIPRESS) 1 MG capsule Take 1 capsule (1 mg total) by mouth at bedtime. 30 capsule 5   traZODone (DESYREL) 100 MG tablet Take 1 tablet (100 mg total) by mouth at bedtime. 30 tablet 5   Vitamin D, Ergocalciferol, (DRISDOL) 1.25 MG (50000 UNIT) CAPS capsule Take 1 capsule (50,000 Units total) by mouth every 7 (seven) days. 6 capsule 0   No current facility-administered medications on file prior to visit.    Social History   Socioeconomic History   Marital status: Married    Spouse name: Not on file   Number of children: Not on file   Years of education: Not on file   Highest education level: Not on file  Occupational History   Not on file  Tobacco Use   Smoking status: Former    Packs/day: 0.25    Types: Cigarettes    Quit date: 2022    Years since quitting: 1.8   Smokeless tobacco: Not on file  Substance and Sexual Activity  Alcohol use: Never   Drug use: Never   Sexual activity: Not on file  Other Topics Concern   Not on file  Social History Narrative   Not on file   Social Determinants of Health   Financial Resource Strain: Not on file  Food Insecurity: Not on file  Transportation Needs: Not on file  Physical Activity: Not on file  Stress: Not on file  Social Connections: Not on file  Intimate Partner Violence: Not on file    No family history on file.  BP (!) 140/90   Pulse 84   Ht 5\' 8"  (1.727 m)   Wt 272 lb 6.4 oz (123.6 kg)   SpO2 96%   BMI 41.42 kg/m   Body mass index is 41.42 kg/m.      Objective:   Physical Exam Vitals and nursing note reviewed. Exam conducted with a chaperone  present.  Constitutional:      Appearance: She is well-developed.  HENT:     Head: Normocephalic and atraumatic.  Eyes:     Conjunctiva/sclera: Conjunctivae normal.     Pupils: Pupils are equal, round, and reactive to light.  Cardiovascular:     Rate and Rhythm: Normal rate and regular rhythm.  Pulmonary:     Effort: Pulmonary effort is normal.  Abdominal:     Palpations: Abdomen is soft.  Musculoskeletal:     Cervical back: Normal range of motion and neck supple.       Feet:  Skin:    General: Skin is warm and dry.  Neurological:     Mental Status: She is alert and oriented to person, place, and time.     Cranial Nerves: No cranial nerve deficit.     Motor: No abnormal muscle tone.     Coordination: Coordination normal.     Deep Tendon Reflexes: Reflexes are normal and symmetric. Reflexes normal.  Psychiatric:        Behavior: Behavior normal.        Thought Content: Thought content normal.        Judgment: Judgment normal.    I have independently reviewed and interpreted x-rays of this patient done at another site by another physician or qualified health professional. I have independently reviewed the MRI.            Assessment & Plan:   Encounter Diagnosis  Name Primary?   Chronic pain of right ankle Yes   I have explained the MRI to her.  I will have her see Dr. Sharol Given in [facility] for further evaluation.  She is agreeable to this.  I will give CAM walker.  Continue the Naprosyn.  Call if any problem.  Precautions discussed.  Electronically Signed Sanjuana Kava, MD 11/22/20238:20 AM

## 2022-04-15 NOTE — Addendum Note (Signed)
Addended by: Cherre Huger E on: 04/15/2022 08:23 AM   Modules accepted: Orders

## 2022-04-27 ENCOUNTER — Ambulatory Visit: Payer: BC Managed Care – PPO | Admitting: Orthopedic Surgery

## 2022-04-30 ENCOUNTER — Ambulatory Visit: Payer: BC Managed Care – PPO | Admitting: Orthopedic Surgery

## 2022-04-30 DIAGNOSIS — G8929 Other chronic pain: Secondary | ICD-10-CM | POA: Diagnosis not present

## 2022-04-30 DIAGNOSIS — S93491A Sprain of other ligament of right ankle, initial encounter: Secondary | ICD-10-CM | POA: Diagnosis not present

## 2022-04-30 DIAGNOSIS — M25571 Pain in right ankle and joints of right foot: Secondary | ICD-10-CM | POA: Diagnosis not present

## 2022-05-04 ENCOUNTER — Encounter: Payer: Self-pay | Admitting: Orthopedic Surgery

## 2022-05-04 DIAGNOSIS — G8929 Other chronic pain: Secondary | ICD-10-CM | POA: Diagnosis not present

## 2022-05-04 DIAGNOSIS — M25571 Pain in right ankle and joints of right foot: Secondary | ICD-10-CM | POA: Diagnosis not present

## 2022-05-04 DIAGNOSIS — S93491A Sprain of other ligament of right ankle, initial encounter: Secondary | ICD-10-CM | POA: Diagnosis not present

## 2022-05-04 MED ORDER — METHYLPREDNISOLONE ACETATE 40 MG/ML IJ SUSP
40.0000 mg | INTRAMUSCULAR | Status: AC | PRN
  Administered 2022-05-04: 40 mg via INTRA_ARTICULAR

## 2022-05-04 MED ORDER — LIDOCAINE HCL 1 % IJ SOLN
2.0000 mL | INTRAMUSCULAR | Status: AC | PRN
  Administered 2022-05-04: 2 mL

## 2022-05-04 NOTE — Progress Notes (Signed)
Office Visit Note   Patient: Patricia Ibarra           Date of Birth: 1991/03/13           MRN: [id number] Visit Date: 04/30/2022              Requested by: Darreld Mclean, MD [address] [place],  [place] [address] PCP: [facility], Alvino Chapel, FNP  Chief Complaint  Patient presents with   Right Ankle - Pain      HPI: Patient is a 31 year old woman who was seen for initial evaluation for chronic right ankle pain.  She is status post an MRI scan on November 19.  Patient states she initially injured her ankle when she tripped over a dog on September 1.  She complains of global ankle pain and swelling.  She is currently nonweightbearing with a kneeling scooter in the fracture boot.  She is taking Naprosyn for pain.  Assessment & Plan: Visit Diagnoses:  1. Chronic pain of right ankle     Plan: The ankle was injected she tolerated this well recommended increasing her weightbearing in the fracture boot as tolerated.  Follow-Up Instructions: Return in about 4 weeks (around 05/28/2022).   Ortho Exam  Patient is alert, oriented, no adenopathy, well-dressed, normal affect, normal respiratory effort. Examination patient has a palpable dorsalis pedis pulse.  She does not have pain to palpation anteriorly over the ankle has pain to palpation over the anterior talofibular ligament.  Anterior drawer is stable she does have swelling laterally.  The peroneal and posterior tibial tendon are nontender to palpation.  Imaging: No results found. No images are attached to the encounter.  Labs: Lab Results  Component Value Date   HGBA1C 5.7 (H) 09/16/2021   ESRSEDRATE 12 10/06/2021     Lab Results  Component Value Date   ALBUMIN 4.4 10/06/2021   ALBUMIN 4.4 09/16/2021    No results found for: "MG" Lab Results  Component Value Date   VD25OH 34.4 09/16/2021    No results found for: "PREALBUMIN"    Latest Ref Rng & Units 10/06/2021    3:01 PM 09/16/2021    9:26 AM  CBC EXTENDED   WBC 4.0 - 10.5 K/uL 10.3  11.2   RBC 3.87 - 5.11 MIL/uL 4.74  4.93   Hemoglobin 12.0 - 15.0 g/dL 95.1  88.4   HCT 16.6 - 46.0 % 38.8  40.9   Platelets 150 - 400 K/uL 268  308   NEUT# 1.7 - 7.7 K/uL 6.9  7.8   Lymph# 0.7 - 4.0 K/uL 2.7  2.7      There is no height or weight on file to calculate BMI.  Orders:  No orders of the defined types were placed in this encounter.  No orders of the defined types were placed in this encounter.    Procedures: Medium Joint Inj: R ankle on 05/04/2022 8:11 AM Indications: pain and diagnostic evaluation Details: 22 G 1.5 in needle, anteromedial approach Medications: 2 mL lidocaine 1 %; 40 mg methylPREDNISolone acetate 40 MG/ML Outcome: tolerated well, no immediate complications Procedure, treatment alternatives, risks and benefits explained, specific risks discussed. Consent was given by the patient. Immediately prior to procedure a time out was called to verify the correct patient, procedure, equipment, support staff and site/side marked as required. Patient was prepped and draped in the usual sterile fashion.      Clinical Data: No additional findings.  ROS:  All other systems negative, except as noted  in the HPI. Review of Systems  Objective: Vital Signs: There were no vitals taken for this visit.  Specialty Comments:  No specialty comments available.  PMFS History: Patient Active Problem List   Diagnosis Date Noted   PTSD (post-traumatic stress disorder) 09/16/2021   Morbid obesity (HCC) 09/16/2021   Abdominal pain 09/17/2020   History of total hysterectomy 09/17/2020   Hypertriglyceridemia 09/17/2020   Vitamin D deficiency 09/17/2020   History of adenomatous polyp of colon 07/07/2019   Anemia 07/07/2019   Cyst of ovary 07/07/2019   Endometritis 07/07/2019   Mixed anxiety and depressive disorder 07/07/2019   History reviewed. No pertinent past medical history.  History reviewed. No pertinent family history.  History  reviewed. No pertinent surgical history. Social History   Occupational History   Not on file  Tobacco Use   Smoking status: Former    Packs/day: 0.25    Types: Cigarettes    Quit date: 2022    Years since quitting: 1.9   Smokeless tobacco: Not on file  Substance and Sexual Activity   Alcohol use: Never   Drug use: Never   Sexual activity: Not on file

## 2022-05-28 ENCOUNTER — Ambulatory Visit: Payer: BC Managed Care – PPO | Admitting: Orthopedic Surgery

## 2022-05-28 DIAGNOSIS — G8929 Other chronic pain: Secondary | ICD-10-CM | POA: Diagnosis not present

## 2022-05-28 DIAGNOSIS — S93491A Sprain of other ligament of right ankle, initial encounter: Secondary | ICD-10-CM | POA: Diagnosis not present

## 2022-05-28 DIAGNOSIS — M25571 Pain in right ankle and joints of right foot: Secondary | ICD-10-CM

## 2022-06-02 ENCOUNTER — Encounter: Payer: Self-pay | Admitting: Orthopedic Surgery

## 2022-06-02 NOTE — Progress Notes (Signed)
Office Visit Note   Patient: Patricia Ibarra           Date of Birth: 12/30/1990           MRN: [id number] Visit Date: 05/28/2022              Requested by: Claire Shown, FNP No address on file PCP: [facility], Trenton Gammon, FNP (Inactive)  Chief Complaint  Patient presents with   Right Ankle - Follow-up      HPI: Patient is a 32 year old woman who presents in follow-up for right ankle pai 4 weeks status post steroid injection of the right ankle.  She is currently in a fracture boot full weightbearing.  She states she fell off her knee scooter yesterday bruising her knee.  Patient states she had about 1 week of relief with the steroid injection.  Assessment & Plan: Visit Diagnoses:  1. Sprain of anterior talofibular ligament of right ankle, initial encounter   2. Chronic pain of right ankle     Plan: Will set up physical therapy for strengthening and proprioception of the right ankle recommended Voltaren gel.  Follow-Up Instructions: No follow-ups on file.   Ortho Exam  Patient is alert, oriented, no adenopathy, well-dressed, normal affect, normal respiratory effort. Examination patient has pain to palpation of the peroneal tendons which were not tender to be 4.  MRI scan showed normal peroneal tendons.  She has good range of motion of her ankle without crepitation anterior drawer is stable there is no effusion.  No skin color or temperature changes.  She has a good pulse.  Imaging: No results found. No images are attached to the encounter.  Labs: Lab Results  Component Value Date   HGBA1C 5.7 (H) 09/16/2021   ESRSEDRATE 12 10/06/2021     Lab Results  Component Value Date   ALBUMIN 4.4 10/06/2021   ALBUMIN 4.4 09/16/2021    No results found for: "MG" Lab Results  Component Value Date   VD25OH 34.4 09/16/2021    No results found for: "PREALBUMIN"    Latest Ref Rng & Units 10/06/2021    3:01 PM 09/16/2021    9:26 AM  CBC EXTENDED  WBC 4.0 - 10.5 K/uL  10.3  11.2   RBC 3.87 - 5.11 MIL/uL 4.74  4.93   Hemoglobin 12.0 - 15.0 g/dL 12.8  13.1   HCT 36.0 - 46.0 % 38.8  40.9   Platelets 150 - 400 K/uL 268  308   NEUT# 1.7 - 7.7 K/uL 6.9  7.8   Lymph# 0.7 - 4.0 K/uL 2.7  2.7      There is no height or weight on file to calculate BMI.  Orders:  Orders Placed This Encounter  Procedures   Ambulatory referral to Physical Therapy   No orders of the defined types were placed in this encounter.    Procedures: No procedures performed  Clinical Data: No additional findings.  ROS:  All other systems negative, except as noted in the HPI. Review of Systems  Objective: Vital Signs: There were no vitals taken for this visit.  Specialty Comments:  No specialty comments available.  PMFS History: Patient Active Problem List   Diagnosis Date Noted   PTSD (post-traumatic stress disorder) 09/16/2021   Morbid obesity ([place]) 09/16/2021   Abdominal pain 09/17/2020   History of total hysterectomy 09/17/2020   Hypertriglyceridemia 09/17/2020   Vitamin D deficiency 09/17/2020   History of adenomatous polyp of colon 07/07/2019   Anemia 07/07/2019  Cyst of ovary 07/07/2019   Endometritis 07/07/2019   Mixed anxiety and depressive disorder 07/07/2019   History reviewed. No pertinent past medical history.  History reviewed. No pertinent family history.  History reviewed. No pertinent surgical history. Social History   Occupational History   Not on file  Tobacco Use   Smoking status: Former    Packs/day: 0.25    Types: Cigarettes    Quit date: 2022    Years since quitting: 2.0   Smokeless tobacco: Not on file  Substance and Sexual Activity   Alcohol use: Never   Drug use: Never   Sexual activity: Not on file

## 2022-06-25 ENCOUNTER — Ambulatory Visit: Payer: BC Managed Care – PPO | Admitting: Orthopedic Surgery

## 2022-07-09 ENCOUNTER — Encounter: Payer: Self-pay | Admitting: Emergency Medicine

## 2022-07-09 ENCOUNTER — Other Ambulatory Visit: Payer: Self-pay

## 2022-07-09 ENCOUNTER — Ambulatory Visit
Admission: EM | Admit: 2022-07-09 | Discharge: 2022-07-09 | Disposition: A | Discharge status: Home / Self Care | Payer: BC Managed Care – PPO | Attending: Family Medicine | Admitting: Family Medicine

## 2022-07-09 DIAGNOSIS — J069 Acute upper respiratory infection, unspecified: Secondary | ICD-10-CM | POA: Diagnosis present

## 2022-07-09 DIAGNOSIS — Z1152 Encounter for screening for COVID-19: Secondary | ICD-10-CM | POA: Diagnosis not present

## 2022-07-09 DIAGNOSIS — R059 Cough, unspecified: Secondary | ICD-10-CM | POA: Diagnosis not present

## 2022-07-09 LAB — POCT INFLUENZA A/B
Influenza A, POC: NEGATIVE
Influenza B, POC: NEGATIVE

## 2022-07-09 MED ORDER — FLUTICASONE PROPIONATE 50 MCG/ACT NA SUSP: 1.0000 | Freq: Two times a day (BID) | NASAL | 2 refills | Status: DC

## 2022-07-09 MED ORDER — PROMETHAZINE-DM 6.25-15 MG/5ML PO SYRP: 5.0000 mL | ORAL_SOLUTION | Freq: Four times a day (QID) | ORAL | 0 refills | Status: DC | PRN

## 2022-07-09 NOTE — ED Triage Notes (Addendum)
Pt reports fatigue, right ear pain, facial pressure, headache since yesterday. Pt reports both daughters are sick but reports one tested negative for Covid last week and reports has also had covid exposure.

## 2022-07-09 NOTE — ED Provider Notes (Signed)
[facility]    CSN: [id number] Arrival date & time: 07/09/22  1615      History   Chief Complaint Chief Complaint  Patient presents with   Sore Throat    Sore throat headache eae ache - Entered by patient    HPI Patricia Ibarra is a 32 y.o. female.   Patient presenting today with 1 day history of fatigue, body aches, headache, congestion, facial pressure, ear pain, cough.  Denies known fever, chest pain, shortness of breath, abdominal pain, nausea vomiting or diarrhea.  So far tried a dose of DayQuil with minimal relief.  Daughter sick with similar symptoms.  COVID exposure.    History reviewed. No pertinent past medical history.  Patient Active Problem List   Diagnosis Date Noted   PTSD (post-traumatic stress disorder) 09/16/2021   Morbid obesity ([place]) 09/16/2021   Abdominal pain 09/17/2020   History of total hysterectomy 09/17/2020   Hypertriglyceridemia 09/17/2020   Vitamin D deficiency 09/17/2020   History of adenomatous polyp of colon 07/07/2019   Anemia 07/07/2019   Cyst of ovary 07/07/2019   Endometritis 07/07/2019   Mixed anxiety and depressive disorder 07/07/2019    History reviewed. No pertinent surgical history.  OB History   No obstetric history on file.      Home Medications    Prior to Admission medications   Medication Sig Start Date End Date Taking? Authorizing Provider  fluticasone (FLONASE) 50 MCG/ACT nasal spray Place 1 spray into both nostrils 2 (two) times daily. 07/09/22  Yes Volney American, PA-C  promethazine-dextromethorphan (PROMETHAZINE-DM) 6.25-15 MG/5ML syrup Take 5 mLs by mouth 4 (four) times daily as needed. 07/09/22  Yes Volney American, PA-C  B Complex-C (B-COMPLEX WITH VITAMIN C) tablet Take 1 tablet by mouth daily.    [provider]  buPROPion (WELLBUTRIN XL) 300 MG 24 hr tablet Take 1 tablet (300 mg total) by mouth daily. 12/22/21   [facility], Trenton Gammon, FNP  cetirizine (ZYRTEC ALLERGY) 10 MG  tablet Take 1 tablet (10 mg total) by mouth daily. 12/22/21   [facility]med[facility], Trenton Gammon, FNP  Iron, Ferrous Sulfate, 325 (65 Fe) MG TABS Take 325 mg by mouth daily. 12/22/21   [facility]med[facility]ite, Trenton Gammon, FNP  naproxen (NAPROSYN) 500 MG tablet Take 1 tablet (500 mg total) by mouth 2 (two) times daily with a meal. 03/31/22   [facility], Trenton Gammon, FNP  prazosin (MINIPRESS) 1 MG capsule Take 1 capsule (1 mg total) by mouth at bedtime. 12/22/21   Amed[facility]ite, Trenton Gammon, FNP  traZODone (DESYREL) 100 MG tablet Take 1 tablet (100 mg total) by mouth at bedtime. 12/22/21   [facility]med[facility], Trenton Gammon, FNP  Vitamin D, Ergocalciferol, (DRISDOL) 1.25 MG (50000 UNIT) CAPS capsule Take 1 capsule (50,000 Units total) by mouth every 7 (seven) days. 10/08/21   [facility], Trenton Gammon, FNP    Family History History reviewed. No pertinent family history.  Social History Social History   Tobacco Use   Smoking status: Former    Packs/day: 0.25    Types: Cigarettes    Quit date: 2022    Years since quitting: 2.1  Substance Use Topics   Alcohol use: Never   Drug use: Never     Allergies   Morphine   Review of Systems Review of Systems Per HPI  Physical Exam Triage Vital Signs ED Triage Vitals  Enc Vitals Group     BP 07/09/22 1703 120/74     Pulse Rate 07/09/22 1703 87  Resp 07/09/22 1703 20     Temp 07/09/22 1703 98 F (36.7 C)     Temp Source 07/09/22 1703 Oral     SpO2 07/09/22 1703 97 %     Weight --      Height --      Head Circumference --      Peak Flow --      Pain Score 07/09/22 1707 5     Pain Loc --      Pain Edu? --      Excl. in [place]? --    No data found.  Updated Vital Signs BP 120/74 (BP Location: Right Arm)   Pulse 87   Temp 98 F (36.7 C) (Oral)   Resp 20   SpO2 97%   Visual Acuity Right Eye Distance:   Left Eye Distance:   Bilateral Distance:    Right Eye Near:   Left Eye Near:    Bilateral Near:     Physical Exam Vitals and nursing note reviewed.  Constitutional:      Appearance:  Normal appearance.  HENT:     Head: Atraumatic.     Right Ear: Tympanic membrane and external ear normal.     Left Ear: Tympanic membrane and external ear normal.     Nose: Rhinorrhea present.     Mouth/Throat:     Mouth: Mucous membranes are moist.     Pharynx: Posterior oropharyngeal erythema present.  Eyes:     Extraocular Movements: Extraocular movements intact.     Conjunctiva/sclera: Conjunctivae normal.  Cardiovascular:     Rate and Rhythm: Normal rate and regular rhythm.     Heart sounds: Normal heart sounds.  Pulmonary:     Effort: Pulmonary effort is normal.     Breath sounds: Normal breath sounds. No wheezing or rales.  Musculoskeletal:        General: Normal range of motion.     Cervical back: Normal range of motion and neck supple.  Skin:    General: Skin is warm and dry.  Neurological:     Mental Status: She is alert and oriented to person, place, and time.  Psychiatric:        Mood and Affect: Mood normal.        Thought Content: Thought content normal.      UC Treatments / Results  Labs (all labs ordered are listed, but only abnormal results are displayed) Labs Reviewed  SARS CORONAVIRUS 2 (TAT 6-24 HRS)  POCT INFLUENZA A/B    EKG   Radiology No results found.  Procedures Procedures (including critical care time)  Medications Ordered in UC Medications - No data to display  Initial Impression / Assessment and Plan / UC Course  I have reviewed the triage vital signs and the nursing notes.  Pertinent labs & imaging results that were available during my care of the patient were reviewed by me and considered in my medical decision making (see chart for details).     Vital signs and exam are reassuring and suggestive of a viral upper respiratory infection.  Rapid flu negative, COVID testing pending.  Discussed supportive over-the-counter medications, home care and Phenergan DM, Flonase for symptomatic relief.  Work note given.  Return for worsening  symptoms.  Final Clinical Impressions(s) / UC Diagnoses   Final diagnoses:  Viral URI with cough   Discharge Instructions   None    ED Prescriptions     Medication Sig Dispense Auth. Provider   promethazine-dextromethorphan (PROMETHAZINE-DM)  6.25-15 MG/5ML syrup Take 5 mLs by mouth 4 (four) times daily as needed. 100 mL Volney American, PA-C   fluticasone [facility]) 50 MCG/ACT nasal spray Place 1 spray into both nostrils 2 (two) times daily. 16 g Volney American, [place]      PDMP not reviewed this encounter.   Volney American, [place] 07/09/22 [phone]

## 2022-07-10 LAB — SARS CORONAVIRUS 2 (TAT 6-24 HRS): SARS Coronavirus 2: NEGATIVE

## 2022-07-21 ENCOUNTER — Ambulatory Visit
Admission: EM | Admit: 2022-07-21 | Discharge: 2022-07-21 | Disposition: A | Discharge status: Home / Self Care | Payer: BC Managed Care – PPO | Attending: Nurse Practitioner | Admitting: Nurse Practitioner

## 2022-07-21 DIAGNOSIS — F418 Other specified anxiety disorders: Secondary | ICD-10-CM

## 2022-07-21 DIAGNOSIS — F4310 Post-traumatic stress disorder, unspecified: Secondary | ICD-10-CM

## 2022-07-21 MED ORDER — TRAZODONE HCL 100 MG PO TABS: 100.0000 mg | ORAL_TABLET | Freq: Every day | ORAL | 0 refills | Status: DC

## 2022-07-21 MED ORDER — BUPROPION HCL ER (XL) 300 MG PO TB24: 300.0000 mg | ORAL_TABLET | Freq: Every day | ORAL | 0 refills | Status: DC

## 2022-07-21 MED ORDER — PRAZOSIN HCL 1 MG PO CAPS: 1.0000 mg | ORAL_CAPSULE | Freq: Every day | ORAL | 0 refills | Status: DC

## 2022-07-21 NOTE — ED Triage Notes (Signed)
Pt presents for refill of: -bupropion '300mg'$  -trazodone 100 mg -prazosin 1 mg

## 2022-07-21 NOTE — ED Provider Notes (Signed)
[facility]    CSN: [id number] Arrival date & time: 07/21/22  1711      History   Chief Complaint Chief Complaint  Patient presents with   Medication Refill    Entered by patient    HPI Patricia Ibarra is a 32 y.o. female.   Patient presents today for medication refill.  Reports she has a history of anxiety and depression as well as posttraumatic stress disorder.  Reports she ran out of her medications a week ago including bupropion XL 300 mg daily, trazodone 100 mg daily, and prazosin 1 mg daily.  Reports prior to running out of her medication, she felt her mood was stable.  Reports her primary care provider left the office and she has not been able to establish with a new primary care provider so far.  She has an appointment coming up at the end of March and is requesting a 1 month refill.  Reports she has been talking with her therapist who recommended that she come to urgent care for medication refill.  She denies palpitations, shortness of breath, chest pain, suicidal homicidal ideation currently.    History reviewed. No pertinent past medical history.  Patient Active Problem List   Diagnosis Date Noted   PTSD (post-traumatic stress disorder) 09/16/2021   Morbid obesity ([place]) 09/16/2021   Abdominal pain 09/17/2020   History of total hysterectomy 09/17/2020   Hypertriglyceridemia 09/17/2020   Vitamin D deficiency 09/17/2020   History of adenomatous polyp of colon 07/07/2019   Anemia 07/07/2019   Cyst of ovary 07/07/2019   Endometritis 07/07/2019   Mixed anxiety and depressive disorder 07/07/2019    History reviewed. No pertinent surgical history.  OB History   No obstetric history on file.      Home Medications    Prior to Admission medications   Medication Sig Start Date End Date Taking? Authorizing Provider  B Complex-C (B-COMPLEX WITH VITAMIN C) tablet Take 1 tablet by mouth daily.    [provider]  buPROPion (WELLBUTRIN XL) 300 MG  24 hr tablet Take 1 tablet (300 mg total) by mouth daily. 07/21/22   Eulogio Bear, NP  cetirizine (ZYRTEC ALLERGY) 10 MG tablet Take 1 tablet (10 mg total) by mouth daily. 12/22/21   [facility], Trenton Gammon, FNP  fluticasone (FLONASE) 50 MCG/ACT nasal spray Place 1 spray into both nostrils 2 (two) times daily. 07/09/22   Volney American, PA-C  Iron, Ferrous Sulfate, 325 (65 Fe) MG TABS Take 325 mg by mouth daily. 12/22/21   [facility], Trenton Gammon, FNP  naproxen (NAPROSYN) 500 MG tablet Take 1 tablet (500 mg total) by mouth 2 (two) times daily with a meal. 03/31/22   [facility], Trenton Gammon, FNP  prazosin (MINIPRESS) 1 MG capsule Take 1 capsule (1 mg total) by mouth at bedtime. 07/21/22   Eulogio Bear, NP  traZODone (DESYREL) 100 MG tablet Take 1 tablet (100 mg total) by mouth at bedtime. 07/21/22   Eulogio Bear, NP  Vitamin D, Ergocalciferol, (DRISDOL) 1.25 MG (50000 UNIT) CAPS capsule Take 1 capsule (50,000 Units total) by mouth every 7 (seven) days. 10/08/21   [facility], Trenton Gammon, FNP    Family History History reviewed. No pertinent family history.  Social History Social History   Tobacco Use   Smoking status: Former    Packs/day: 0.25    Types: Cigarettes    Quit date: 2022    Years since quitting: 2.1  Substance Use Topics   Alcohol use: Never  Drug use: Never     Allergies   Morphine   Review of Systems Review of Systems Per HPI  Physical Exam Triage Vital Signs ED Triage Vitals [07/21/22 1718]  Enc Vitals Group     BP 132/82     Pulse Rate 76     Resp 16     Temp (!) 97.4 F (36.3 C)     Temp Source Oral     SpO2 97 %     Weight      Height      Head Circumference      Peak Flow      Pain Score 0     Pain Loc      Pain Edu?      Excl. in [place]?    No data found.  Updated Vital Signs BP 132/82 (BP Location: Right Arm)   Pulse 76   Temp (!) 97.4 F (36.3 C) (Oral)   Resp 16   SpO2 97%   Visual Acuity Right Eye Distance:   Left Eye Distance:    Bilateral Distance:    Right Eye Near:   Left Eye Near:    Bilateral Near:     Physical Exam Vitals and nursing note reviewed.  Constitutional:      General: She is not in acute distress.    Appearance: Normal appearance. She is not toxic-appearing.  HENT:     Head: Normocephalic and atraumatic.  Pulmonary:     Effort: Pulmonary effort is normal. No respiratory distress.  Neurological:     Mental Status: She is alert and oriented to person, place, and time.  Psychiatric:        Behavior: Behavior is cooperative.      UC Treatments / Results  Labs (all labs ordered are listed, but only abnormal results are displayed) Labs Reviewed - No data to display  EKG   Radiology No results found.  Procedures Procedures (including critical care time)  Medications Ordered in UC Medications - No data to display  Initial Impression / Assessment and Plan / UC Course  I have reviewed the triage vital signs and the nursing notes.  Pertinent labs & imaging results that were available during my care of the patient were reviewed by me and considered in my medical decision making (see chart for details).   Patient is well-appearing, normotensive, afebrile, not tachycardic, not tachypneic, oxygenating well on room air.    1. Mixed anxiety and depressive disorder 2. PTSD (post-traumatic stress disorder) Refills sent for the 3 medications including bupropion XL 300 mg daily, trazodone 100 mg daily, and prazosin 1 mg daily.  30-day supply given.  Recommended follow-up with new primary care provider as planned.  Seek care if any new symptoms develop in meantime.  The patient was given the opportunity to ask questions.  All questions answered to their satisfaction.  The patient is in agreement to this plan.    Final Clinical Impressions(s) / UC Diagnoses   Final diagnoses:  Mixed anxiety and depressive disorder  PTSD (post-traumatic stress disorder)     Discharge Instructions       I have sent refills of the medication we discussed to your pharmacy today.  As we discussed, please still keep the follow-up visit with you next primary care provider to make sure you have refills for next month.    ED Prescriptions     Medication Sig Dispense Auth. Provider   buPROPion (WELLBUTRIN XL) 300 MG 24 hr tablet Take  1 tablet (300 mg total) by mouth daily. 30 tablet Noemi Chapel A, NP   prazosin (MINIPRESS) 1 MG capsule Take 1 capsule (1 mg total) by mouth at bedtime. 30 capsule Noemi Chapel A, NP   traZODone (DESYREL) 100 MG tablet Take 1 tablet (100 mg total) by mouth at bedtime. 30 tablet Eulogio Bear, NP      PDMP not reviewed this encounter.   Eulogio Bear, NP 07/21/22 [phone]

## 2022-07-21 NOTE — Discharge Instructions (Signed)
I have sent refills of the medication we discussed to your pharmacy today.  As we discussed, please still keep the follow-up visit with you next primary care provider to make sure you have refills for next month.

## 2022-07-23 ENCOUNTER — Encounter: Payer: Self-pay | Admitting: Radiology

## 2022-08-01 ENCOUNTER — Ambulatory Visit
Admission: EM | Admit: 2022-08-01 | Discharge: 2022-08-01 | Disposition: A | Discharge status: Home / Self Care | Payer: BC Managed Care – PPO | Attending: Family Medicine | Admitting: Family Medicine

## 2022-08-01 ENCOUNTER — Ambulatory Visit (INDEPENDENT_AMBULATORY_CARE_PROVIDER_SITE_OTHER): Payer: BC Managed Care – PPO

## 2022-08-01 DIAGNOSIS — M79641 Pain in right hand: Secondary | ICD-10-CM | POA: Diagnosis not present

## 2022-08-01 DIAGNOSIS — S60221A Contusion of right hand, initial encounter: Secondary | ICD-10-CM | POA: Diagnosis not present

## 2022-08-01 NOTE — ED Provider Notes (Signed)
[facility]    CSN: [id number] Arrival date & time: 08/01/22  1002      History   Chief Complaint Chief Complaint  Patient presents with   Hand Problem    HPI Patricia Ibarra is a 32 y.o. female.   Patient presenting today with 1 week history of right hand pain, bruising after getting it caught between several items while moving furniture last week.  She then states yesterday she fell and tried to catch herself on the hand.  She does have some bruising and swelling, denies loss of range of motion, skin injury, numbness, tingling.  Tried ibuprofen and ice with mild temporary relief.    History reviewed. No pertinent past medical history.  Patient Active Problem List   Diagnosis Date Noted   PTSD (post-traumatic stress disorder) 09/16/2021   Morbid obesity ([place]) 09/16/2021   Abdominal pain 09/17/2020   History of total hysterectomy 09/17/2020   Hypertriglyceridemia 09/17/2020   Vitamin D deficiency 09/17/2020   History of adenomatous polyp of colon 07/07/2019   Anemia 07/07/2019   Cyst of ovary 07/07/2019   Endometritis 07/07/2019   Mixed anxiety and depressive disorder 07/07/2019    History reviewed. No pertinent surgical history.  OB History   No obstetric history on file.      Home Medications    Prior to Admission medications   Medication Sig Start Date End Date Taking? Authorizing Provider  B Complex-C (B-COMPLEX WITH VITAMIN C) tablet Take 1 tablet by mouth daily.    [provider]  buPROPion (WELLBUTRIN XL) 300 MG 24 hr tablet Take 1 tablet (300 mg total) by mouth daily. 07/21/22   Eulogio Bear, NP  cetirizine (ZYRTEC ALLERGY) 10 MG tablet Take 1 tablet (10 mg total) by mouth daily. 12/22/21   [facility], Trenton Gammon, FNP  fluticasone (FLONASE) 50 MCG/ACT nasal spray Place 1 spray into both nostrils 2 (two) times daily. 07/09/22   Volney American, PA-C  Iron, Ferrous Sulfate, 325 (65 Fe) MG TABS Take 325 mg by mouth daily.  12/22/21   [facility], Trenton Gammon, FNP  naproxen (NAPROSYN) 500 MG tablet Take 1 tablet (500 mg total) by mouth 2 (two) times daily with a meal. 03/31/22   [facility]med[facility]ite, Trenton Gammon, FNP  prazosin (MINIPRESS) 1 MG capsule Take 1 capsule (1 mg total) by mouth at bedtime. 07/21/22   Eulogio Bear, NP  traZODone (DESYREL) 100 MG tablet Take 1 tablet (100 mg total) by mouth at bedtime. 07/21/22   Eulogio Bear, NP  Vitamin D, Ergocalciferol, (DRISDOL) 1.25 MG (50000 UNIT) CAPS capsule Take 1 capsule (50,000 Units total) by mouth every 7 (seven) days. 10/08/21   [facility]med[facility]ite, Trenton Gammon, FNP    Family History History reviewed. No pertinent family history.  Social History Social History   Tobacco Use   Smoking status: Former    Packs/day: 0.25    Types: Cigarettes    Quit date: 2022    Years since quitting: 2.1  Substance Use Topics   Alcohol use: Never   Drug use: Never     Allergies   Morphine   Review of Systems Review of Systems PER HPI  Physical Exam Triage Vital Signs ED Triage Vitals  Enc Vitals Group     BP 08/01/22 1052 109/76     Pulse Rate 08/01/22 1052 81     Resp 08/01/22 1052 20     Temp 08/01/22 1052 97.9 F (36.6 C)     Temp Source 08/01/22 1052  Oral     SpO2 08/01/22 1052 99 %     Weight --      Height --      Head Circumference --      Peak Flow --      Pain Score 08/01/22 1129 3     Pain Loc --      Pain Edu? --      Excl. in [place]? --    No data found.  Updated Vital Signs BP 109/76 (BP Location: Right Arm)   Pulse 81   Temp 97.9 F (36.6 C) (Oral)   Resp 20   SpO2 99%   Visual Acuity Right Eye Distance:   Left Eye Distance:   Bilateral Distance:    Right Eye Near:   Left Eye Near:    Bilateral Near:     Physical Exam Vitals and nursing note reviewed.  Constitutional:      Appearance: Normal appearance. She is not ill-appearing.  HENT:     Head: Atraumatic.  Eyes:     Extraocular Movements: Extraocular movements intact.      Conjunctiva/sclera: Conjunctivae normal.  Cardiovascular:     Rate and Rhythm: Normal rate and regular rhythm.     Heart sounds: Normal heart sounds.  Pulmonary:     Effort: Pulmonary effort is normal.     Breath sounds: Normal breath sounds.  Musculoskeletal:        General: Swelling, tenderness and signs of injury present. No deformity. Normal range of motion.     Cervical back: Normal range of motion and neck supple.     Comments: Small area of bruising, tenderness, trace edema to the dorsal right hand over the first and second metacarpals.  Range of motion and strength intact  Skin:    General: Skin is warm and dry.     Findings: Bruising present.  Neurological:     Mental Status: She is alert and oriented to person, place, and time.     Comments: Right hand neurovascularly intact  Psychiatric:        Mood and Affect: Mood normal.        Thought Content: Thought content normal.        Judgment: Judgment normal.    UC Treatments / Results  Labs (all labs ordered are listed, but only abnormal results are displayed) Labs Reviewed - No data to display  EKG   Radiology DG Hand Complete Right  Result Date: 08/01/2022 CLINICAL DATA:  Fall and tried to catch herself. EXAM: RIGHT HAND - COMPLETE 3+ VIEW COMPARISON:  None Available. FINDINGS: There is no evidence of fracture or dislocation. Normal joint spaces and alignment. There is no evidence of arthropathy or other focal bone abnormality. Soft tissues are unremarkable. IMPRESSION: Negative radiographs of the right hand.  No fracture or dislocation. Electronically Signed   By: Keith Rake M.D.   On: 08/01/2022 11:22    Procedures Procedures (including critical care time)  Medications Ordered in UC Medications - No data to display  Initial Impression / Assessment and Plan / UC Course  I have reviewed the triage vital signs and the nursing notes.  Pertinent labs & imaging results that were available during my care of the  patient were reviewed by me and considered in my medical decision making (see chart for details).     Xray of the right hand negative for acute bony abnormality, discussed RICE protocol, over-the-counter pain relievers.  Return for worsening symptoms.  Final Clinical Impressions(s) /  UC Diagnoses   Final diagnoses:  Contusion of right hand, initial encounter   Discharge Instructions   None    ED Prescriptions   None    PDMP not reviewed this encounter.   Volney American, [place] 08/01/22 1233

## 2022-08-01 NOTE — ED Triage Notes (Signed)
Pt reports x 1 week she was moving furniture and her right hand got caught between "some stuff" Pt states then yesterday she fell and tried to catch herself with her right hand. Pt has active ROM  in her right hand. Slight trouble bending thumb.

## 2022-08-13 ENCOUNTER — Ambulatory Visit: Payer: BC Managed Care – PPO | Admitting: Family Medicine

## 2022-08-13 DIAGNOSIS — J302 Other seasonal allergic rhinitis: Secondary | ICD-10-CM

## 2022-08-13 DIAGNOSIS — D509 Iron deficiency anemia, unspecified: Secondary | ICD-10-CM

## 2022-08-13 DIAGNOSIS — F418 Other specified anxiety disorders: Secondary | ICD-10-CM | POA: Diagnosis not present

## 2022-08-13 DIAGNOSIS — F4310 Post-traumatic stress disorder, unspecified: Secondary | ICD-10-CM | POA: Diagnosis not present

## 2022-08-13 DIAGNOSIS — H7393 Unspecified disorder of tympanic membrane, bilateral: Secondary | ICD-10-CM | POA: Insufficient documentation

## 2022-08-13 MED ORDER — CETIRIZINE HCL 10 MG PO TABS: 10.0000 mg | ORAL_TABLET | Freq: Every day | ORAL | 1 refills | Status: DC

## 2022-08-13 MED ORDER — FLUTICASONE PROPIONATE 50 MCG/ACT NA SUSP: 1.0000 | Freq: Two times a day (BID) | NASAL | 2 refills | Status: AC

## 2022-08-13 MED ORDER — IRON (FERROUS SULFATE) 325 (65 FE) MG PO TABS: 325.0000 mg | ORAL_TABLET | Freq: Every day | ORAL | 1 refills | Status: DC

## 2022-08-13 MED ORDER — PRAZOSIN HCL 1 MG PO CAPS: 1.0000 mg | ORAL_CAPSULE | Freq: Every day | ORAL | 1 refills | Status: DC

## 2022-08-13 MED ORDER — AMOXICILLIN-POT CLAVULANATE 875-125 MG PO TABS
1.0000 | ORAL_TABLET | Freq: Two times a day (BID) | ORAL | 0 refills | Status: DC
Start: 2022-08-13 — End: 2022-10-24

## 2022-08-13 MED ORDER — TRAZODONE HCL 100 MG PO TABS: 100.0000 mg | ORAL_TABLET | Freq: Every day | ORAL | 1 refills | Status: DC

## 2022-08-13 MED ORDER — BUPROPION HCL ER (XL) 300 MG PO TB24: 300.0000 mg | ORAL_TABLET | Freq: Every day | ORAL | 1 refills | Status: DC

## 2022-08-13 NOTE — Assessment & Plan Note (Signed)
TMs with effusion.  Empiric Augmentin.  Referring to ENT.

## 2022-08-13 NOTE — Patient Instructions (Signed)
Continue your medications.  I have placed a referral to ENT.

## 2022-08-13 NOTE — Assessment & Plan Note (Signed)
Stable.  Continue iron.

## 2022-08-13 NOTE — Assessment & Plan Note (Signed)
Stable.  Continue current medications.  Medications were refilled today.

## 2022-08-13 NOTE — Progress Notes (Signed)
Subjective:  Patient ID: Patricia Ibarra, female    DOB: Apr 10, 1991  Age: 32 y.o. MRN: [id number]  CC: Chief Complaint  Patient presents with   Establish Care    Ear pain x 2 days    HPI:  32 year old female presents for evaluation of the above.  Patient reports recurring issues with her right ear.  She states that this has been bothering her for the past 2 days.  No URI symptoms.  No relieving factors.  Patient's anxiety, depression, PTSD are stable on Wellbutrin, prazosin, and trazodone.  Patient is anemia stable.  She is on iron therapy.  Needs refill.  Patient Active Problem List   Diagnosis Date Noted   Abnormal tympanic membrane of both ears 08/13/2022   PTSD (post-traumatic stress disorder) 09/16/2021   Morbid obesity ([place]) 09/16/2021   History of total hysterectomy 09/17/2020   Hypertriglyceridemia 09/17/2020   Vitamin D deficiency 09/17/2020   History of adenomatous polyp of colon 07/07/2019   Anemia 07/07/2019   Mixed anxiety and depressive disorder 07/07/2019    Social Hx   Social History   Socioeconomic History   Marital status: Married    Spouse name: Not on file   Number of children: Not on file   Years of education: Not on file   Highest education level: Some college, no degree  Occupational History   Not on file  Tobacco Use   Smoking status: Former    Packs/day: .25    Types: Cigarettes    Quit date: 2022    Years since quitting: 2.2   Smokeless tobacco: Not on file  Substance and Sexual Activity   Alcohol use: Never   Drug use: Never   Sexual activity: Yes    Birth control/protection: Surgical  Other Topics Concern   Not on file  Social History Narrative   Not on file   Social Determinants of Health   Financial Resource Strain: Low Risk  (08/13/2022)   Overall Financial Resource Strain (CARDIA)    Difficulty of Paying Living Expenses: Not very hard  Food Insecurity: No Food Insecurity (08/13/2022)   Hunger Vital Sign    Worried  About Running Out of Food in the Last Year: Never true    Ran Out of Food in the Last Year: Never true  Transportation Needs: No Transportation Needs (08/13/2022)   PRAPARE - Hydrologist (Medical): No    Lack of Transportation (Non-Medical): No  Physical Activity: Unknown (08/13/2022)   Exercise Vital Sign    Days of Exercise per Week: Patient declined    Minutes of Exercise per Session: Not on file  Stress: No Stress Concern Present (08/13/2022)   [place]    Feeling of Stress : Only a little  Social Connections: Moderately Integrated (08/13/2022)   Social Connection and Isolation Panel [NHANES]    Frequency of Communication with Friends and Family: More than three times a week    Frequency of Social Gatherings with Friends and Family: Once a week    Attends Religious Services: More than 4 times per year    Active Member of [employer] or Organizations: Yes    Attends Music therapist: More than 4 times per year    Marital Status: Separated    Review of Systems Per HPI  Objective:  BP 110/76   Pulse 78   Temp (!) 97.4 F (36.3 C)   Ht 5\' 8"  (1.727 m)  Wt 264 lb (119.7 kg)   BMI 40.14 kg/m      08/13/2022   10:34 AM 08/01/2022   10:52 AM 07/21/2022    5:18 PM  BP/Weight  Systolic BP [id number]  Diastolic BP 76 76 82  Wt. (Lbs) 264    BMI 40.14 kg/m2      Physical Exam Vitals and nursing note reviewed.  Constitutional:      Appearance: Normal appearance. She is obese.  HENT:     Head: Normocephalic and atraumatic.     Ears:     Comments: Both TMs with effusion. Cardiovascular:     Rate and Rhythm: Normal rate and regular rhythm.  Pulmonary:     Effort: Pulmonary effort is normal.     Breath sounds: Normal breath sounds.  Neurological:     General: No focal deficit present.     Mental Status: She is alert.  Psychiatric:        Behavior: Behavior normal.      Lab Results  Component Value Date   WBC 10.3 10/06/2021   HGB 12.8 10/06/2021   HCT 38.8 10/06/2021   PLT 268 10/06/2021   GLUCOSE 98 10/06/2021   CHOL 134 09/16/2021   TRIG 99 09/16/2021   HDL 35 (L) 09/16/2021   LDLCALC 80 09/16/2021   ALT 43 10/06/2021   AST 21 10/06/2021   NA 137 10/06/2021   K 4.0 10/06/2021   CL 103 10/06/2021   CREATININE 0.66 10/06/2021   BUN 10 10/06/2021   CO2 27 10/06/2021   TSH 1.840 09/16/2021   HGBA1C 5.7 (H) 09/16/2021     Assessment & Plan:   Problem List Items Addressed This Visit       Nervous and Auditory   Abnormal tympanic membrane of both ears    TMs with effusion.  Empiric Augmentin.  Referring to ENT.      Relevant Orders   Ambulatory referral to ENT     Other   Anemia (Chronic)    Stable.  Continue iron.      Relevant Medications   Iron, Ferrous Sulfate, 325 (65 Fe) MG TABS   PTSD (post-traumatic stress disorder)   Relevant Medications   buPROPion (WELLBUTRIN XL) 300 MG 24 hr tablet   prazosin (MINIPRESS) 1 MG capsule   traZODone (DESYREL) 100 MG tablet   Mixed anxiety and depressive disorder    Stable.  Continue current medications.  Medications were refilled today.      Relevant Medications   buPROPion (WELLBUTRIN XL) 300 MG 24 hr tablet   traZODone (DESYREL) 100 MG tablet   Other Visit Diagnoses     Seasonal allergies       Relevant Medications   cetirizine (ZYRTEC ALLERGY) 10 MG tablet       Meds ordered this encounter  Medications   buPROPion (WELLBUTRIN XL) 300 MG 24 hr tablet    Sig: Take 1 tablet (300 mg total) by mouth daily.    Dispense:  90 tablet    Refill:  1   cetirizine (ZYRTEC ALLERGY) 10 MG tablet    Sig: Take 1 tablet (10 mg total) by mouth daily.    Dispense:  90 tablet    Refill:  1   fluticasone (FLONASE) 50 MCG/ACT nasal spray    Sig: Place 1 spray into both nostrils 2 (two) times daily.    Dispense:  16 g    Refill:  2   Iron, Ferrous Sulfate, 325 (65 Fe) MG TABS  Sig: Take 325 mg by mouth daily.    Dispense:  90 tablet    Refill:  1   prazosin (MINIPRESS) 1 MG capsule    Sig: Take 1 capsule (1 mg total) by mouth at bedtime.    Dispense:  90 capsule    Refill:  1   traZODone (DESYREL) 100 MG tablet    Sig: Take 1 tablet (100 mg total) by mouth at bedtime.    Dispense:  90 tablet    Refill:  1   amoxicillin-clavulanate (AUGMENTIN) 875-125 MG tablet    Sig: Take 1 tablet by mouth 2 (two) times daily.    Dispense:  14 tablet    Refill:  0    Follow-up:  Return in about 6 months (around 02/13/2023).  [place]

## 2022-08-17 ENCOUNTER — Other Ambulatory Visit: Payer: Self-pay | Admitting: Nurse Practitioner

## 2022-08-17 DIAGNOSIS — F418 Other specified anxiety disorders: Secondary | ICD-10-CM

## 2022-08-17 DIAGNOSIS — F4310 Post-traumatic stress disorder, unspecified: Secondary | ICD-10-CM

## 2022-08-18 NOTE — Telephone Encounter (Signed)
Unable to refill per protocol, last refill by another provider not at this practice.  Requested Prescriptions  Pending Prescriptions Disp Refills   buPROPion (WELLBUTRIN XL) 300 MG 24 hr tablet [Pharmacy Med Name: BUPROPION XL 300MG  TABLETS] 30 tablet     Sig: TAKE 1 TABLET(300 MG) BY MOUTH DAILY     Psychiatry: Antidepressants - bupropion Failed - 08/17/2022  3:37 AM      Failed - Valid encounter within last 6 months    Recent Outpatient Visits   None     Future Appointments             In 5 months Coral Spikes, DO [facility], [place] in normal range and within 360 days    Creatinine  Date Value Ref Range Status  10/06/2021 0.66 0.44 - 1.00 mg/dL Final         Passed - AST in normal range and within 360 days    AST  Date Value Ref Range Status  10/06/2021 21 15 - 41 U/L Final         Passed - ALT in normal range and within 360 days    ALT  Date Value Ref Range Status  10/06/2021 43 0 - 44 U/L Final         Passed - Completed PHQ-2 or PHQ-9 in the last 360 days      Passed - Last BP in normal range    BP Readings from Last 1 Encounters:  08/13/22 110/76          prazosin (MINIPRESS) 1 MG capsule [Pharmacy Med Name: PRAZOSIN 1MG  CAPSULES] 30 capsule     Sig: TAKE 1 CAPSULE(1 MG) BY MOUTH AT BEDTIME     Cardiovascular:  Alpha Blockers Failed - 08/17/2022  3:37 AM      Failed - Valid encounter within last 6 months    Recent Outpatient Visits   None     Future Appointments             In 5 months Cook, Jayce G, DO [facility], [place] - Last BP in normal range    BP Readings from Last 1 Encounters:  08/13/22 110/76          traZODone (DESYREL) 100 MG tablet [Pharmacy Med Name: TRAZODONE 100MG  TABLETS] 30 tablet     Sig: TAKE 1 TABLET(100 MG) BY MOUTH AT BEDTIME     Psychiatry: Antidepressants - Serotonin Modulator Failed - 08/17/2022  3:37 AM      Failed -  Valid encounter within last 6 months    Recent Outpatient Visits   None     Future Appointments             In 5 months Coral Spikes, DO [place], [place] - Completed PHQ-2 or PHQ-9 in the last 360 days

## 2022-09-05 LAB — LAB REPORT - SCANNED
A1c: 5
EGFR (Non-African Amer.): 99

## 2022-10-07 ENCOUNTER — Other Ambulatory Visit: Payer: Self-pay | Admitting: Family Medicine

## 2022-10-07 NOTE — Telephone Encounter (Signed)
Unable to refill per protocol, last refill by another provider not at this practice.  Requested Prescriptions  Pending Prescriptions Disp Refills   fluticasone (FLONASE) 50 MCG/ACT nasal spray [Pharmacy Med Name: FLUTICASONE 50MCG NASAL SP (120) RX] 16 g 2    Sig: SHAKE LIQUID AND USE 1 SPRAY IN EACH NOSTRIL TWICE DAILY     There is no refill protocol information for this order      

## 2022-10-24 ENCOUNTER — Ambulatory Visit
Admission: EM | Admit: 2022-10-24 | Discharge: 2022-10-24 | Disposition: A | Discharge status: Home / Self Care | Payer: BC Managed Care – PPO | Attending: Nurse Practitioner | Admitting: Nurse Practitioner

## 2022-10-24 DIAGNOSIS — N3001 Acute cystitis with hematuria: Secondary | ICD-10-CM

## 2022-10-24 DIAGNOSIS — N898 Other specified noninflammatory disorders of vagina: Secondary | ICD-10-CM | POA: Diagnosis present

## 2022-10-24 LAB — POCT URINALYSIS DIP (MANUAL ENTRY)
Bilirubin, UA: NEGATIVE
Glucose, UA: NEGATIVE mg/dL
Ketones, POC UA: NEGATIVE mg/dL
Nitrite, UA: NEGATIVE
Protein Ur, POC: NEGATIVE mg/dL
Spec Grav, UA: 1.025 (ref 1.010–1.025)
Urobilinogen, UA: 0.2 E.U./dL
pH, UA: 5.5 (ref 5.0–8.0)

## 2022-10-24 MED ORDER — PHENAZOPYRIDINE HCL 100 MG PO TABS: 100.0000 mg | ORAL_TABLET | Freq: Three times a day (TID) | ORAL | 0 refills | Status: DC | PRN

## 2022-10-24 MED ORDER — NITROFURANTOIN MONOHYD MACRO 100 MG PO CAPS: 100.0000 mg | ORAL_CAPSULE | Freq: Two times a day (BID) | ORAL | 0 refills | Status: AC

## 2022-10-24 NOTE — Discharge Instructions (Signed)
Take the Macrobid as prescribed to treat UTI.  You can take the Pyridium as needed to help with bladder pain and symptoms while the antibiotic is taking effect.  Urine culture is pending and we will call you early next week if we need to change the antibiotic.  Continue hydration plenty of water, seek care immediately in the ER if you develop high fever, nausea/vomiting and are unable to keep fluids down.  We are testing you today for yeast infection, BV, gonorrhea, chlamydia, trichomonas, HIV, and syphilis and will notify you if any of that testing is positive and we will prescribe treatment early next week.

## 2022-10-24 NOTE — ED Triage Notes (Signed)
Pt states that she is having burning,itching and frequent urination x 4 days. No discharge.

## 2022-10-24 NOTE — ED Provider Notes (Signed)
[facility]    CSN: [id number] Arrival date & time: 10/24/22  0803      History   Chief Complaint Chief Complaint  Patient presents with   Urinary Tract Infection    HPI Patricia Ibarra is a 32 y.o. female.   Patient presents today with 4-day history of dysuria, urinary urgency and frequency, voiding smaller amounts, foul urinary odor, and itching.  States the itching feels like it is on the inside and outside.  No new urinary incontinence, hematuria, abdominal pain, back pain, suprapubic pain or pressure, flank pain, fever, nausea/vomiting, or vaginal discharge.  Reports she and her husband recently separated and she did have unprotected sex with a new sexual partner prior to symptoms starting.  No known exposures to STI and no vaginal sores, rashes, lesions, or pelvic pain.  Has taken Azo for symptoms with minimal improvement.  She reports history of UTI, however this was more than 3 months ago.  Denies antibiotic use in the past 90 days.  Patient is confident she is not pregnant; has had hysterectomy.    History reviewed. No pertinent past medical history.  Patient Active Problem List   Diagnosis Date Noted   Abnormal tympanic membrane of both ears 08/13/2022   PTSD (post-traumatic stress disorder) 09/16/2021   Morbid obesity ([facility]) 09/16/2021   History of total hysterectomy 09/17/2020   Hypertriglyceridemia 09/17/2020   Vitamin D deficiency 09/17/2020   History of adenomatous polyp of colon 07/07/2019   Anemia 07/07/2019   Mixed anxiety and depressive disorder 07/07/2019    History reviewed. No pertinent surgical history.  OB History   No obstetric history on file.      Home Medications    Prior to Admission medications   Medication Sig Start Date End Date Taking? Authorizing Provider  B Complex-C (B-COMPLEX WITH VITAMIN C) tablet Take 1 tablet by mouth daily.   Yes [provider]  buPROPion (WELLBUTRIN XL) 300 MG 24 hr tablet Take 1  tablet (300 mg total) by mouth daily. 08/13/22  Yes Cook, Jayce G, DO  cetirizine (ZYRTEC ALLERGY) 10 MG tablet Take 1 tablet (10 mg total) by mouth daily. 08/13/22  Yes Cook, Jayce G, DO  fluticasone (FLONASE) 50 MCG/ACT nasal spray Place 1 spray into both nostrils 2 (two) times daily. 08/13/22  Yes Cook, Jayce G, DO  Iron, Ferrous Sulfate, 325 (65 Fe) MG TABS Take 325 mg by mouth daily. 08/13/22  Yes Cook, Jayce G, DO  liothyronine (CYTOMEL) 5 MCG tablet Take 10 mcg by mouth daily. 10/16/22  Yes [provider]  naproxen (NAPROSYN) 500 MG tablet Take 1 tablet (500 mg total) by mouth 2 (two) times daily with a meal. 03/31/22  Yes [employer], Alvino Chapel, FNP  nitrofurantoin, macrocrystal-monohydrate, (MACROBID) 100 MG capsule Take 1 capsule (100 mg total) by mouth 2 (two) times daily for 5 days. 10/24/22 10/29/22 Yes Valentino Nose, NP  phenazopyridine (PYRIDIUM) 100 MG tablet Take 1 tablet (100 mg total) by mouth 3 (three) times daily as needed for pain (bladder pain). 10/24/22  Yes Cathlean Marseilles A, NP  prazosin (MINIPRESS) 1 MG capsule Take 1 capsule (1 mg total) by mouth at bedtime. 08/13/22  Yes Cook, Jayce G, DO  traZODone (DESYREL) 100 MG tablet Take 1 tablet (100 mg total) by mouth at bedtime. 08/13/22  Yes Tommie Sams, DO    Family History History reviewed. No pertinent family history.  Social History Social History   Tobacco Use   Smoking status: Former  Packs/day: .25    Types: Cigarettes    Quit date: 2022    Years since quitting: 2.4  Substance Use Topics   Alcohol use: Never   Drug use: Never     Allergies   Morphine   Review of Systems Review of Systems Per HPI  Physical Exam Triage Vital Signs ED Triage Vitals  Enc Vitals Group     BP 10/24/22 0815 113/77     Pulse Rate 10/24/22 0815 91     Resp 10/24/22 0815 18     Temp 10/24/22 0815 98.1 F (36.7 C)     Temp Source 10/24/22 0815 Oral     SpO2 10/24/22 0815 96 %     Weight 10/24/22 0812 242 lb  (109.8 kg)     Height --      Head Circumference --      Peak Flow --      Pain Score 10/24/22 0812 0     Pain Loc --      Pain Edu? --      Excl. in GC? --    No data found.  Updated Vital Signs BP 113/77 (BP Location: Right Arm)   Pulse 91   Temp 98.1 F (36.7 C) (Oral)   Resp 18   Wt 242 lb (109.8 kg)   SpO2 96%   BMI 36.80 kg/m   Visual Acuity Right Eye Distance:   Left Eye Distance:   Bilateral Distance:    Right Eye Near:   Left Eye Near:    Bilateral Near:     Physical Exam Vitals and nursing note reviewed.  Constitutional:      General: She is not in acute distress.    Appearance: She is not toxic-appearing.  Cardiovascular:     Rate and Rhythm: Normal rate and regular rhythm.  Pulmonary:     Effort: Pulmonary effort is normal. No respiratory distress.  Abdominal:     General: Abdomen is flat. There is no distension.     Palpations: Abdomen is soft.     Tenderness: There is no right CVA tenderness or left CVA tenderness.  Genitourinary:    Comments: Deferred-self swab obtained by patient Skin:    General: Skin is warm and dry.     Coloration: Skin is not jaundiced or pale.     Findings: No erythema.  Neurological:     Mental Status: She is alert and oriented to person, place, and time.  Psychiatric:        Behavior: Behavior is cooperative.      UC Treatments / Results  Labs (all labs ordered are listed, but only abnormal results are displayed) Labs Reviewed  POCT URINALYSIS DIP (MANUAL ENTRY) - Abnormal; Notable for the following components:      Result Value   Clarity, UA hazy (*)    Blood, UA trace-intact (*)    Leukocytes, UA Small (1+) (*)    All other components within normal limits  URINE CULTURE  HIV ANTIBODY (ROUTINE TESTING W REFLEX)  RPR  CERVICOVAGINAL ANCILLARY ONLY    EKG   Radiology No results found.  Procedures Procedures (including critical care time)  Medications Ordered in UC Medications - No data to  display  Initial Impression / Assessment and Plan / UC Course  I have reviewed the triage vital signs and the nursing notes.  Pertinent labs & imaging results that were available during my care of the patient were reviewed by me and considered in my medical  decision making (see chart for details).   Patient is well-appearing, normotensive, afebrile, not tachycardic, not tachypneic, oxygenating well on room air.    1. Acute cystitis with hematuria Urinalysis today shows trace intact blood, 1+ leukocyte Estrace Will treat with Macrobid while urine culture is pending Start Pyridium as needed for bladder pain  2. Itching in the vaginal area Cytology swab swab performed by patient HIV and syphilis testing obtained Treat as indicated Recommended condom use with every sexual encounter moving forward  The patient was given the opportunity to ask questions.  All questions answered to their satisfaction.  The patient is in agreement to this plan.    Final Clinical Impressions(s) / UC Diagnoses   Final diagnoses:  Acute cystitis with hematuria  Itching in the vaginal area     Discharge Instructions      Take the Macrobid as prescribed to treat UTI.  You can take the Pyridium as needed to help with bladder pain and symptoms while the antibiotic is taking effect.  Urine culture is pending and we will call you early next week if we need to change the antibiotic.  Continue hydration plenty of water, seek care immediately in the ER if you develop high fever, nausea/vomiting and are unable to keep fluids down.  We are testing you today for yeast infection, BV, gonorrhea, chlamydia, trichomonas, HIV, and syphilis and will notify you if any of that testing is positive and we will prescribe treatment early next week.    ED Prescriptions     Medication Sig Dispense Auth. Provider   nitrofurantoin, macrocrystal-monohydrate, (MACROBID) 100 MG capsule Take 1 capsule (100 mg total) by mouth 2 (two)  times daily for 5 days. 10 capsule Cathlean Marseilles A, NP   phenazopyridine (PYRIDIUM) 100 MG tablet Take 1 tablet (100 mg total) by mouth 3 (three) times daily as needed for pain (bladder pain). 10 tablet Valentino Nose, NP      PDMP not reviewed this encounter.   Valentino Nose, NP 10/24/22 [phone]

## 2022-10-25 LAB — URINE CULTURE: Culture: NO GROWTH

## 2022-10-26 LAB — CERVICOVAGINAL ANCILLARY ONLY
Bacterial Vaginitis (gardnerella): POSITIVE — AB
Candida Glabrata: NEGATIVE
Candida Vaginitis: POSITIVE — AB
Chlamydia: NEGATIVE
Comment: NEGATIVE
Comment: NEGATIVE
Comment: NEGATIVE
Comment: NEGATIVE
Comment: NEGATIVE
Comment: NORMAL
Neisseria Gonorrhea: NEGATIVE
Trichomonas: NEGATIVE

## 2022-10-26 LAB — HIV ANTIBODY (ROUTINE TESTING W REFLEX): HIV Screen 4th Generation wRfx: NONREACTIVE

## 2022-10-26 LAB — RPR: RPR Ser Ql: NONREACTIVE

## 2022-10-27 ENCOUNTER — Telehealth: Payer: Self-pay | Admitting: Emergency Medicine

## 2022-10-27 MED ORDER — FLUCONAZOLE 150 MG PO TABS: 150.0000 mg | ORAL_TABLET | Freq: Once | ORAL | 0 refills | Status: AC

## 2022-10-27 MED ORDER — METRONIDAZOLE 500 MG PO TABS
500.0000 mg | ORAL_TABLET | Freq: Two times a day (BID) | ORAL | 0 refills | Status: DC
Start: 2022-10-27 — End: 2023-02-12

## 2023-01-13 ENCOUNTER — Other Ambulatory Visit: Payer: Self-pay | Admitting: Family Medicine

## 2023-01-13 DIAGNOSIS — F418 Other specified anxiety disorders: Secondary | ICD-10-CM

## 2023-01-13 DIAGNOSIS — F4310 Post-traumatic stress disorder, unspecified: Secondary | ICD-10-CM

## 2023-02-12 ENCOUNTER — Ambulatory Visit: Payer: BC Managed Care – PPO | Admitting: Family Medicine

## 2023-02-12 VITALS — BP 91/61 | HR 74 | Temp 97.7°F | Ht 68.0 in | Wt 218.4 lb

## 2023-02-12 DIAGNOSIS — F418 Other specified anxiety disorders: Secondary | ICD-10-CM

## 2023-02-12 DIAGNOSIS — E781 Pure hyperglyceridemia: Secondary | ICD-10-CM

## 2023-02-12 DIAGNOSIS — D649 Anemia, unspecified: Secondary | ICD-10-CM

## 2023-02-12 DIAGNOSIS — R946 Abnormal results of thyroid function studies: Secondary | ICD-10-CM

## 2023-02-12 DIAGNOSIS — Z13228 Encounter for screening for other metabolic disorders: Secondary | ICD-10-CM

## 2023-02-12 NOTE — Assessment & Plan Note (Signed)
Labs to assess today.

## 2023-02-12 NOTE — Assessment & Plan Note (Signed)
Stable.  Continue current medications.

## 2023-02-12 NOTE — Patient Instructions (Signed)
Continue your medications.  Labs today.  Follow up in 6 months.

## 2023-02-12 NOTE — Progress Notes (Signed)
Subjective:  Patient ID: Patricia Ibarra, female    DOB: 10/27/1990  Age: 32 y.o. MRN: [id number]  CC: Follow up   HPI:  33 year old female with the below mentioned medical problems presents for follow-up.  Anxiety and depression stable.  She is doing well on Wellbutrin, prazosin, and trazodone.  Patient has been seeing [facility].  She is now on semaglutide and Cytomel.  I recommended that she exhibit caution regarding these medications from [facility].  I am not sure how or what they are compounding the semaglutide with.  I also advised her that Cytomel is not recommended in medication in regards to weight loss.  She has lost a significant amount of weight.  She is happy with the progress.  I advised against continuing these medications from [facility].  Needs labs today.  Declines flu shot.  Patient Active Problem List   Diagnosis Date Noted   PTSD (post-traumatic stress disorder) 09/16/2021   History of total hysterectomy 09/17/2020   Hypertriglyceridemia 09/17/2020   Vitamin D deficiency 09/17/2020   Anemia 07/07/2019   Mixed anxiety and depressive disorder 07/07/2019    Social Hx   Social History   Socioeconomic History   Marital status: Married    Spouse name: Not on file   Number of children: Not on file   Years of education: Not on file   Highest education level: Some college, no degree  Occupational History   Not on file  Tobacco Use   Smoking status: Former    Current packs/day: 0.00    Types: Cigarettes    Quit date: 2022    Years since quitting: 2.7   Smokeless tobacco: Not on file  Substance and Sexual Activity   Alcohol use: Never   Drug use: Never   Sexual activity: Yes    Birth control/protection: Surgical  Other Topics Concern   Not on file  Social History Narrative   Not on file   Social Determinants of Health   Financial Resource Strain: Low Risk  (08/13/2022)   Overall Financial Resource Strain (CARDIA)    Difficulty of Paying Living  Expenses: Not very hard  Food Insecurity: No Food Insecurity (08/13/2022)   Hunger Vital Sign    Worried About Running Out of Food in the Last Year: Never true    Ran Out of Food in the Last Year: Never true  Transportation Needs: No Transportation Needs (08/13/2022)   PRAPARE - Administrator, Civil Service (Medical): No    Lack of Transportation (Non-Medical): No  Physical Activity: Unknown (08/13/2022)   Exercise Vital Sign    Days of Exercise per Week: Patient declined    Minutes of Exercise per Session: Not on file  Stress: No Stress Concern Present (08/13/2022)   [employer] of Occupational Health - Occupational Stress Questionnaire    Feeling of Stress : Only a little  Social Connections: Moderately Integrated (08/13/2022)   Social Connection and Isolation Panel [NHANES]    Frequency of Communication with Friends and Family: More than three times a week    Frequency of Social Gatherings with Friends and Family: Once a week    Attends Religious Services: More than 4 times per year    Active Member of [employer] or Organizations: Yes    Attends Engineer, structural: More than 4 times per year    Marital Status: Separated    Review of Systems  Gastrointestinal:  Positive for constipation.   Per HPI  Objective:  BP 91/61   Pulse 74   Temp 97.7 F (36.5 C) (Oral)   Ht 5\' 8"  (1.727 m)   Wt 218 lb 6.4 oz (99.1 kg)   SpO2 97%   BMI 33.21 kg/m      02/12/2023    8:50 AM 10/24/2022    8:15 AM 10/24/2022    8:12 AM  BP/Weight  Systolic BP 91 113   Diastolic BP 61 77   Wt. (Lbs) 218.4  242  BMI 33.21 kg/m2  36.8 kg/m2    Physical Exam Vitals and nursing note reviewed.  Constitutional:      General: She is not in acute distress.    Appearance: Normal appearance.  HENT:     Head: Normocephalic and atraumatic.  Eyes:     General:        Right eye: No discharge.        Left eye: No discharge.     Conjunctiva/sclera: Conjunctivae normal.   Cardiovascular:     Rate and Rhythm: Normal rate and regular rhythm.  Pulmonary:     Effort: Pulmonary effort is normal.     Breath sounds: Normal breath sounds. No wheezing or rales.  Neurological:     Mental Status: She is alert.  Psychiatric:     Comments: Flat affect.     Lab Results  Component Value Date   WBC 10.3 10/06/2021   HGB 12.8 10/06/2021   HCT 38.8 10/06/2021   PLT 268 10/06/2021   GLUCOSE 98 10/06/2021   CHOL 134 09/16/2021   TRIG 99 09/16/2021   HDL 35 (L) 09/16/2021   LDLCALC 80 09/16/2021   ALT 43 10/06/2021   AST 21 10/06/2021   NA 137 10/06/2021   K 4.0 10/06/2021   CL 103 10/06/2021   CREATININE 0.66 10/06/2021   BUN 10 10/06/2021   CO2 27 10/06/2021   TSH 1.840 09/16/2021   HGBA1C 5.7 (H) 09/16/2021     Assessment & Plan:   Problem List Items Addressed This Visit       Other   Anemia (Chronic)    Labs to assess today.      Relevant Orders   CBC   Iron, TIBC and Ferritin Panel   Hypertriglyceridemia   Relevant Orders   Lipid panel   Mixed anxiety and depressive disorder - Primary    Stable. Continue current medications.      Other Visit Diagnoses     Thyroid function test abnormal       Relevant Orders   TSH + free T4   T3, Free   Screening for metabolic disorder       Relevant Orders   CMP14+EGFR      Follow-up:  Return in about 6 months (around 08/12/2023).  Everlene Other DO [facility]

## 2023-02-13 LAB — LIPID PANEL
Chol/HDL Ratio: 3.7 ratio (ref 0.0–4.4)
Cholesterol, Total: 144 mg/dL (ref 100–199)
HDL: 39 mg/dL — ABNORMAL LOW (ref >39)
LDL Chol Calc (NIH): 91 mg/dL (ref 0–99)
Triglycerides: 72 mg/dL (ref 0–149)
VLDL Cholesterol Cal: 14 mg/dL (ref 5–40)

## 2023-02-13 LAB — CMP14+EGFR
ALT: 32 IU/L (ref 0–32)
AST: 17 IU/L (ref 0–40)
Albumin: 4.1 g/dL (ref 3.9–4.9)
Alkaline Phosphatase: 72 IU/L (ref 44–121)
BUN/Creatinine Ratio: 18 (ref 9–23)
BUN: 11 mg/dL (ref 6–20)
Bilirubin Total: 0.2 mg/dL (ref 0.0–1.2)
CO2: 25 mmol/L (ref 20–29)
Calcium: 9.1 mg/dL (ref 8.7–10.2)
Chloride: 103 mmol/L (ref 96–106)
Creatinine, Ser: 0.61 mg/dL (ref 0.57–1.00)
Globulin, Total: 2.3 g/dL (ref 1.5–4.5)
Glucose: 89 mg/dL (ref 70–99)
Potassium: 5.1 mmol/L (ref 3.5–5.2)
Sodium: 140 mmol/L (ref 134–144)
Total Protein: 6.4 g/dL (ref 6.0–8.5)
eGFR: 120 mL/min/{1.73_m2} (ref >59)

## 2023-02-13 LAB — CBC
Hematocrit: 43 % (ref 34.0–46.6)
Hemoglobin: 13.2 g/dL (ref 11.1–15.9)
MCH: 28 pg (ref 26.6–33.0)
MCHC: 30.7 g/dL — ABNORMAL LOW (ref 31.5–35.7)
MCV: 91 fL (ref 79–97)
Platelets: 263 10*3/uL (ref 150–450)
RBC: 4.71 x10E6/uL (ref 3.77–5.28)
RDW: 11.9 % (ref 11.7–15.4)
WBC: 11 10*3/uL — ABNORMAL HIGH (ref 3.4–10.8)

## 2023-02-13 LAB — IRON,TIBC AND FERRITIN PANEL
Ferritin: 203 ng/mL — ABNORMAL HIGH (ref 15–150)
Iron Saturation: 13 % — ABNORMAL LOW (ref 15–55)
Iron: 35 ug/dL (ref 27–159)
Total Iron Binding Capacity: 264 ug/dL (ref 250–450)
UIBC: 229 ug/dL (ref 131–425)

## 2023-02-13 LAB — T3, FREE: T3, Free: 3.7 pg/mL (ref 2.0–4.4)

## 2023-02-13 LAB — TSH+FREE T4
Free T4: 0.79 ng/dL — ABNORMAL LOW (ref 0.82–1.77)
TSH: 1.4 u[IU]/mL (ref 0.450–4.500)

## 2023-07-06 ENCOUNTER — Other Ambulatory Visit (HOSPITAL_COMMUNITY)
Admission: RE | Admit: 2023-07-06 | Discharge: 2023-07-06 | Disposition: A | Discharge status: Home / Self Care | Payer: BC Managed Care – PPO | Source: Ambulatory Visit | Attending: Obstetrics & Gynecology | Admitting: Obstetrics & Gynecology

## 2023-07-06 ENCOUNTER — Encounter: Payer: Self-pay | Admitting: Obstetrics & Gynecology

## 2023-07-06 ENCOUNTER — Ambulatory Visit: Payer: BC Managed Care – PPO | Admitting: Obstetrics & Gynecology

## 2023-07-06 VITALS — BP 117/75 | HR 82 | Ht 66.0 in | Wt 237.0 lb

## 2023-07-06 DIAGNOSIS — Z113 Encounter for screening for infections with a predominantly sexual mode of transmission: Secondary | ICD-10-CM | POA: Insufficient documentation

## 2023-07-06 DIAGNOSIS — Z01419 Encounter for gynecological examination (general) (routine) without abnormal findings: Secondary | ICD-10-CM

## 2023-07-06 DIAGNOSIS — R0789 Other chest pain: Secondary | ICD-10-CM | POA: Diagnosis not present

## 2023-07-06 NOTE — Progress Notes (Signed)
 Subjective:     Patricia Ibarra is a 33 y.o. female here for a routine exam.  No LMP recorded. Patient has had a hysterectomy. G1P0 Birth Control Method:  hysterectomy + RSO for "bad" endometriosis in [place] Menstrual Calendar(currently): amenorrhea  Current complaints: right breast pain for 1 month.   Current acute medical issues:  na   Recent Gynecologic History No LMP recorded. Patient has had a hysterectomy. Last Pap: 2019,  normal Last mammogram: years ago,    Past Medical History:  Diagnosis Date   Anemia    Anxiety and depression    Endometriosis     Past Surgical History:  Procedure Laterality Date   LAPAROSCOPIC HYSTERECTOMY  2019   RIGHT OOPHORECTOMY      OB History     Gravida  1   Para      Term      Preterm      AB      Living  1      SAB      IAB      Ectopic      Multiple      Live Births              Social History   Socioeconomic History   Marital status: Legally Separated    Spouse name: Not on file   Number of children: Not on file   Years of education: Not on file   Highest education level: Some college, no degree  Occupational History   Not on file  Tobacco Use   Smoking status: Former    Current packs/day: 0.00    Types: Cigarettes    Quit date: 2022    Years since quitting: 3.1   Smokeless tobacco: Not on file  Vaping Use   Vaping status: Never Used  Substance and Sexual Activity   Alcohol use: Yes    Comment: occas   Drug use: Never   Sexual activity: Yes    Birth control/protection: Surgical    Comment: hyst  Other Topics Concern   Not on file  Social History Narrative   Not on file   Social Drivers of Health   Financial Resource Strain: Low Risk  (07/06/2023)   Overall Financial Resource Strain (CARDIA)    Difficulty of Paying Living Expenses: Not very hard  Food Insecurity: No Food Insecurity (07/06/2023)   Hunger Vital Sign    Worried About Running Out of Food in the Last Year: Never true    Ran  Out of Food in the Last Year: Never true  Transportation Needs: No Transportation Needs (08/13/2022)   PRAPARE - Administrator, Civil Service (Medical): No    Lack of Transportation (Non-Medical): No  Physical Activity: Insufficiently Active (07/06/2023)   Exercise Vital Sign    Days of Exercise per Week: 2 days    Minutes of Exercise per Session: 30 min  Stress: Stress Concern Present (07/06/2023)   [employer] of Occupational Health - Occupational Stress Questionnaire    Feeling of Stress : To some extent  Social Connections: Moderately Isolated (07/06/2023)   Social Connection and Isolation Panel [NHANES]    Frequency of Communication with Friends and Family: More than three times a week    Frequency of Social Gatherings with Friends and Family: Once a week    Attends Religious Services: 1 to 4 times per year    Active Member of [employer] or Organizations: No    Attends Banker Meetings:  Never    Marital Status: Separated    Family History  Problem Relation Age of Onset   Diabetes Paternal Grandfather    Colon cancer Maternal Grandmother    Bladder Cancer Maternal Grandmother    Dementia Maternal Grandmother    Diabetes Maternal Grandfather    Stroke Maternal Grandfather    Dementia Maternal Grandfather    Stroke Father    Atrial fibrillation Father    CAD Mother    Breast cancer Maternal Aunt 42   Cancer - Other Maternal Aunt        unsure if ovarian or cervical     Current Outpatient Medications:    buPROPion (WELLBUTRIN XL) 300 MG 24 hr tablet, TAKE 1 TABLET(300 MG) BY MOUTH DAILY, Disp: 90 tablet, Rfl: 1   cetirizine (ZYRTEC ALLERGY) 10 MG tablet, Take 1 tablet (10 mg total) by mouth daily., Disp: 90 tablet, Rfl: 1   fluticasone (FLONASE) 50 MCG/ACT nasal spray, Place 1 spray into both nostrils 2 (two) times daily., Disp: 16 g, Rfl: 2   Iron, Ferrous Sulfate, 325 (65 Fe) MG TABS, Take 325 mg by mouth daily., Disp: 90 tablet, Rfl: 1   prazosin  (MINIPRESS) 1 MG capsule, TAKE 1 CAPSULE(1 MG) BY MOUTH AT BEDTIME, Disp: 90 capsule, Rfl: 1   traZODone (DESYREL) 100 MG tablet, TAKE 1 TABLET(100 MG) BY MOUTH AT BEDTIME, Disp: 90 tablet, Rfl: 1   B Complex-C (B-COMPLEX WITH VITAMIN C) tablet, Take 1 tablet by mouth daily. (Patient not taking: Reported on 07/06/2023), Disp: , Rfl:   Review of Systems  Review of Systems  Constitutional: Negative for fever, chills, weight loss, malaise/fatigue and diaphoresis.  HENT: Negative for hearing loss, ear pain, nosebleeds, congestion, sore throat, neck pain, tinnitus and ear discharge.   Eyes: Negative for blurred vision, double vision, photophobia, pain, discharge and redness.  Respiratory: Negative for cough, hemoptysis, sputum production, shortness of breath, wheezing and stridor.   Cardiovascular: Negative for chest pain, palpitations, orthopnea, claudication, leg swelling and PND.  Gastrointestinal: negative for abdominal pain. Negative for heartburn, nausea, vomiting, diarrhea, constipation, blood in stool and melena.  Genitourinary: Negative for dysuria, urgency, frequency, hematuria and flank pain.  Musculoskeletal: Negative for myalgias, back pain, joint pain and falls.  Skin: Negative for itching and rash.  Neurological: Negative for dizziness, tingling, tremors, sensory change, speech change, focal weakness, seizures, loss of consciousness, weakness and headaches.  Endo/Heme/Allergies: Negative for environmental allergies and polydipsia. Does not bruise/bleed easily.  Psychiatric/Behavioral: Negative for depression, suicidal ideas, hallucinations, memory loss and substance abuse. The patient is not nervous/anxious and does not have insomnia.        Objective:  Blood pressure 117/75, pulse 82, height 5\' 6"  (1.676 m), weight 237 lb (107.5 kg).   Physical Exam  Vitals reviewed. Constitutional: She is oriented to person, place, and time. She appears well-developed and well-nourished.  HENT:   Head: Normocephalic and atraumatic.        Right Ear: External ear normal.  Left Ear: External ear normal.  Nose: Nose normal.  Mouth/Throat: Oropharynx is clear and moist.  Eyes: Conjunctivae and EOM are normal. Pupils are equal, round, and reactive to light. Right eye exhibits no discharge. Left eye exhibits no discharge. No scleral icterus.  Neck: Normal range of motion. Neck supple. No tracheal deviation present. No thyromegaly present.  Cardiovascular: Normal rate, regular rhythm, normal heart sounds and intact distal pulses.  Exam reveals no gallop and no friction rub.   No murmur heard. Respiratory:  Effort normal and breath sounds normal. No respiratory distress. She has no wheezes. She has no rales. She exhibits no tenderness.  GI: Soft. Bowel sounds are normal. She exhibits no distension and no mass. There is no tenderness. There is no rebound and no guarding.  Genitourinary:  Breasts no masses skin changes or nipple changes bilaterally No pain in the breast itself, +tenderness in the chest wall not breast       Vulva is normal without lesions Vagina is pink moist without discharge, good estrogen effect Cervix absent Uterus is absent Adnexa is negative right ovary absent, left ovary is palpable  Musculoskeletal: Normal range of motion. She exhibits no edema and no tenderness.  Neurological: She is alert and oriented to person, place, and time. She has normal reflexes. She displays normal reflexes. No cranial nerve deficit. She exhibits normal muscle tone. Coordination normal.  Skin: Skin is warm and dry. No rash noted. No erythema. No pallor.  Psychiatric: She has a normal mood and affect. Her behavior is normal. Judgment and thought content normal.       Medications Ordered at today's visit: No orders of the defined types were placed in this encounter.   Other orders placed at today's visit: Orders Placed This Encounter  Procedures   HIV antibody (with reflex)   RPR    Hepatitis C Antibody     ASSESSMENT + PLAN:    ICD-10-CM   1. Well woman exam with routine gynecological exam  Z01.419     2. Chest wall pain  R07.89     3. Routine screening for STI (sexually transmitted infection)  Z11.3 Cervicovaginal ancillary only( [place])    HIV antibody (with reflex)    RPR    Hepatitis C Antibody          Return if symptoms worsen or fail to improve.

## 2023-07-07 ENCOUNTER — Encounter: Payer: Self-pay | Admitting: Obstetrics & Gynecology

## 2023-07-07 LAB — CERVICOVAGINAL ANCILLARY ONLY
Chlamydia: NEGATIVE
Comment: NEGATIVE
Comment: NEGATIVE
Comment: NORMAL
Neisseria Gonorrhea: NEGATIVE
Trichomonas: NEGATIVE

## 2023-07-07 LAB — HIV ANTIBODY (ROUTINE TESTING W REFLEX): HIV Screen 4th Generation wRfx: NONREACTIVE

## 2023-07-07 LAB — HEPATITIS C ANTIBODY: Hep C Virus Ab: NONREACTIVE

## 2023-07-07 LAB — RPR: RPR Ser Ql: NONREACTIVE

## 2023-08-12 ENCOUNTER — Ambulatory Visit: Payer: BC Managed Care – PPO | Admitting: Family Medicine

## 2023-09-02 ENCOUNTER — Other Ambulatory Visit: Payer: Self-pay | Admitting: Family Medicine

## 2023-09-02 DIAGNOSIS — J302 Other seasonal allergic rhinitis: Secondary | ICD-10-CM

## 2023-09-02 DIAGNOSIS — D509 Iron deficiency anemia, unspecified: Secondary | ICD-10-CM

## 2023-09-18 ENCOUNTER — Other Ambulatory Visit: Payer: Self-pay | Admitting: Family Medicine

## 2023-09-18 DIAGNOSIS — F418 Other specified anxiety disorders: Secondary | ICD-10-CM

## 2023-10-22 ENCOUNTER — Encounter: Payer: Self-pay | Admitting: Family Medicine

## 2023-10-22 ENCOUNTER — Ambulatory Visit: Admitting: Family Medicine

## 2023-10-22 VITALS — BP 110/78 | HR 85 | Temp 97.9°F | Ht 66.0 in | Wt 238.0 lb

## 2023-10-22 DIAGNOSIS — D509 Iron deficiency anemia, unspecified: Secondary | ICD-10-CM

## 2023-10-22 DIAGNOSIS — N3000 Acute cystitis without hematuria: Secondary | ICD-10-CM

## 2023-10-22 DIAGNOSIS — E669 Obesity, unspecified: Secondary | ICD-10-CM

## 2023-10-22 DIAGNOSIS — B9689 Other specified bacterial agents as the cause of diseases classified elsewhere: Secondary | ICD-10-CM

## 2023-10-22 DIAGNOSIS — F418 Other specified anxiety disorders: Secondary | ICD-10-CM | POA: Diagnosis not present

## 2023-10-22 DIAGNOSIS — N760 Acute vaginitis: Secondary | ICD-10-CM

## 2023-10-22 DIAGNOSIS — J029 Acute pharyngitis, unspecified: Secondary | ICD-10-CM

## 2023-10-22 LAB — POCT URINALYSIS DIP (CLINITEK)
Bilirubin, UA: NEGATIVE
Glucose, UA: NEGATIVE mg/dL
Ketones, POC UA: NEGATIVE mg/dL
Nitrite, UA: POSITIVE — AB
Spec Grav, UA: 1.01 (ref 1.010–1.025)
Urobilinogen, UA: 0.2 U/dL
pH, UA: 7.5 (ref 5.0–8.0)

## 2023-10-22 MED ORDER — SEMAGLUTIDE-WEIGHT MANAGEMENT 0.25 MG/0.5ML ~~LOC~~ SOAJ: 0.2500 mg | SUBCUTANEOUS | 0 refills | Status: AC

## 2023-10-22 MED ORDER — AMOXICILLIN-POT CLAVULANATE 875-125 MG PO TABS
1.0000 | ORAL_TABLET | Freq: Two times a day (BID) | ORAL | 0 refills | Status: AC
Start: 2023-10-22 — End: ?

## 2023-10-22 MED ORDER — FERROUS SULFATE 325 (65 FE) MG PO TABS
325.0000 mg | ORAL_TABLET | ORAL | 1 refills | Status: AC
Start: 2023-10-22 — End: ?

## 2023-10-22 MED ORDER — METRONIDAZOLE 0.75 % EX GEL
CUTANEOUS | 1 refills | Status: AC
Start: 2023-10-22 — End: ?

## 2023-10-22 MED ORDER — BUPROPION HCL ER (XL) 300 MG PO TB24
300.0000 mg | ORAL_TABLET | Freq: Every day | ORAL | 1 refills | Status: AC
Start: 2023-10-22 — End: ?

## 2023-10-22 NOTE — Progress Notes (Addendum)
 Subjective:  Patient ID: Patricia Ibarra, female    DOB: 06/25/1990  Age: 33 y.o. MRN: [id number]  CC:   Chief Complaint  Patient presents with  . Generalized Body Aches    Sore throat, ear aches, head aches since sunday  . Dysuria    HPI:  33 year old female presents with multiple complaints.  Patient reports that she has had ongoing respiratory symptoms since Sunday or Monday.  She reports body aches, sore throat, ear pain, headache.  Denies fever.  Patient also reports that she has been having dysuria and cloudy foul-smelling urine for the past 2 days.  Concern for UTI.  Patient states that she has had recent frequent UTIs.  She has also had a few bouts of bacterial vaginosis in the past few months.  Patient would like to discuss things that she can do to prevent these from occurring.  Patient was previously seen at [facility].  She states that she has gained some weight since she is no longer on GLP-1 medication.  She is requesting weight loss medication.  Today, patient needs refills on her iron  as well as bupropion .  Patient Active Problem List   Diagnosis Date Noted  . Acute cystitis without hematuria 10/22/2023  . Bacterial vaginosis 10/22/2023  . Obesity (BMI 30-39.9) 10/22/2023  . PTSD (post-traumatic stress disorder) 09/16/2021  . History of total hysterectomy 09/17/2020  . Hypertriglyceridemia 09/17/2020  . Vitamin D  deficiency 09/17/2020  . Anemia 07/07/2019  . Mixed anxiety and depressive disorder 07/07/2019    Social Hx   Social History   Socioeconomic History  . Marital status: Legally Separated    Spouse name: Not on file  . Number of children: Not on file  . Years of education: Not on file  . Highest education level: Some college, no degree  Occupational History  . Not on file  Tobacco Use  . Smoking status: Former    Current packs/day: 0.00    Types: Cigarettes    Quit date: 2022    Years since quitting: 3.4  . Smokeless tobacco: Not on file   Vaping Use  . Vaping status: Never Used  Substance and Sexual Activity  . Alcohol use: Yes    Comment: occas  . Drug use: Never  . Sexual activity: Yes    Birth control/protection: Surgical    Comment: hyst  Other Topics Concern  . Not on file  Social History Narrative  . Not on file   Social Drivers of Health   Financial Resource Strain: Low Risk  (07/06/2023)   Overall Financial Resource Strain (CARDIA)   . Difficulty of Paying Living Expenses: Not very hard  Food Insecurity: No Food Insecurity (07/06/2023)   Hunger Vital Sign   . Worried About Programme researcher, broadcasting/film/video in the Last Year: Never true   . Ran Out of Food in the Last Year: Never true  Transportation Needs: No Transportation Needs (08/13/2022)   PRAPARE - Transportation   . Lack of Transportation (Medical): No   . Lack of Transportation (Non-Medical): No  Physical Activity: Insufficiently Active (07/06/2023)   Exercise Vital Sign   . Days of Exercise per Week: 2 days   . Minutes of Exercise per Session: 30 min  Stress: Stress Concern Present (07/06/2023)   [employer] of Occupational Health - Occupational Stress Questionnaire   . Feeling of Stress : To some extent  Social Connections: Moderately Isolated (07/06/2023)   Social Connection and Isolation Panel [NHANES]   . Frequency of  Communication with Friends and Family: More than three times a week   . Frequency of Social Gatherings with Friends and Family: Once a week   . Attends Religious Services: 1 to 4 times per year   . Active Member of Clubs or Organizations: No   . Attends Banker Meetings: Never   . Marital Status: Separated    Review of Systems Per HPI  Objective:  BP 110/78   Pulse 85   Temp 97.9 F (36.6 C)   Ht 5' 6 (1.676 m)   Wt 238 lb (108 kg)   SpO2 96%   BMI 38.41 kg/m      10/22/2023    9:10 AM 07/06/2023    8:41 AM 02/12/2023    8:50 AM  BP/Weight  Systolic BP 110 117 91  Diastolic BP 78 75 61  Wt. (Lbs) 238  237 218.4  BMI 38.41 kg/m2 38.25 kg/m2 33.21 kg/m2    Physical Exam Vitals and nursing note reviewed.  Constitutional:      General: She is not in acute distress.    Appearance: Normal appearance. She is obese.  HENT:     Head: Normocephalic and atraumatic.   Eyes:     General:        Right eye: No discharge.        Left eye: No discharge.     Conjunctiva/sclera: Conjunctivae normal.    Cardiovascular:     Rate and Rhythm: Normal rate and regular rhythm.  Pulmonary:     Effort: Pulmonary effort is normal.     Breath sounds: Normal breath sounds. No wheezing, rhonchi or rales.   Neurological:     Mental Status: She is alert.   Psychiatric:        Mood and Affect: Mood normal.        Behavior: Behavior normal.   Lab Results  Component Value Date   WBC 11.0 (H) 02/12/2023   HGB 13.2 02/12/2023   HCT 43.0 02/12/2023   PLT 263 02/12/2023   GLUCOSE 89 02/12/2023   CHOL 144 02/12/2023   TRIG 72 02/12/2023   HDL 39 (L) 02/12/2023   LDLCALC 91 02/12/2023   ALT 32 02/12/2023   AST 17 02/12/2023   NA 140 02/12/2023   K 5.1 02/12/2023   CL 103 02/12/2023   CREATININE 0.61 02/12/2023   BUN 11 02/12/2023   CO2 25 02/12/2023   TSH 1.400 02/12/2023   HGBA1C 5.7 (H) 09/16/2021     Assessment & Plan:  Acute cystitis without hematuria Assessment & Plan: UA consistent with UTI.  Treating with Augmentin  which will cover respiratory symptoms as well as urinary tract infection.  Awaiting culture.  Orders: -     POCT URINALYSIS DIP (CLINITEK) -     Amoxicillin -Pot Clavulanate; Take 1 tablet by mouth 2 (two) times daily.  Dispense: 14 tablet; Refill: 0 -     Urine Culture  Sore throat  Mixed anxiety and depressive disorder Assessment & Plan: Wellbutrin  refilled.  Orders: -     buPROPion  HCl ER (XL); Take 1 tablet (300 mg total) by mouth daily.  Dispense: 90 tablet; Refill: 1  Iron  deficiency anemia, unspecified iron  deficiency anemia type -     Ferrous Sulfate ; Take  1 tablet (325 mg total) by mouth every other day.  Dispense: 45 tablet; Refill: 1  Bacterial vaginosis Assessment & Plan: Recurrent.  MetroGel  for suppressive therapy.  Orders: -     metroNIDAZOLE ; Apply twice weekly for  suppression of BV.  Dispense: 45 g; Refill: 1  Obesity (BMI 30-39.9) Assessment & Plan: Sending in Wegovy .  Orders: -     Semaglutide -Weight Management; Inject 0.25 mg into the skin once a week.  Dispense: 2 mL; Refill: 0   Chania Kochanski DO [facility]

## 2023-10-22 NOTE — Assessment & Plan Note (Signed)
 Recurrent.  MetroGel  for suppressive therapy.

## 2023-10-22 NOTE — Patient Instructions (Signed)
 Antibiotic as prescribed.  Awaiting culture.  Medication sent for suppression of BV.  Weight loss medication sent.  Take care  Dr. Debrah Fan

## 2023-10-22 NOTE — Assessment & Plan Note (Signed)
Sending in Wegovy. 

## 2023-10-22 NOTE — Assessment & Plan Note (Signed)
 Wellbutrin refilled

## 2023-10-22 NOTE — Assessment & Plan Note (Signed)
 UA consistent with UTI.  Treating with Augmentin  which will cover respiratory symptoms as well as urinary tract infection.  Awaiting culture.

## 2023-10-25 LAB — SPECIMEN STATUS REPORT

## 2023-10-25 LAB — URINE CULTURE

## 2023-10-26 ENCOUNTER — Ambulatory Visit: Payer: Self-pay | Admitting: Family Medicine

## 2023-12-05 IMAGING — DX DG WRIST COMPLETE 3+V*R*
3 series · 3 of 3 positions shown · non-contrast
Comparison: None

CLINICAL DATA: Injured wrist this morning.  Pain.

EXAM:
RIGHT WRIST - COMPLETE 3+ VIEW

[wrist pa]
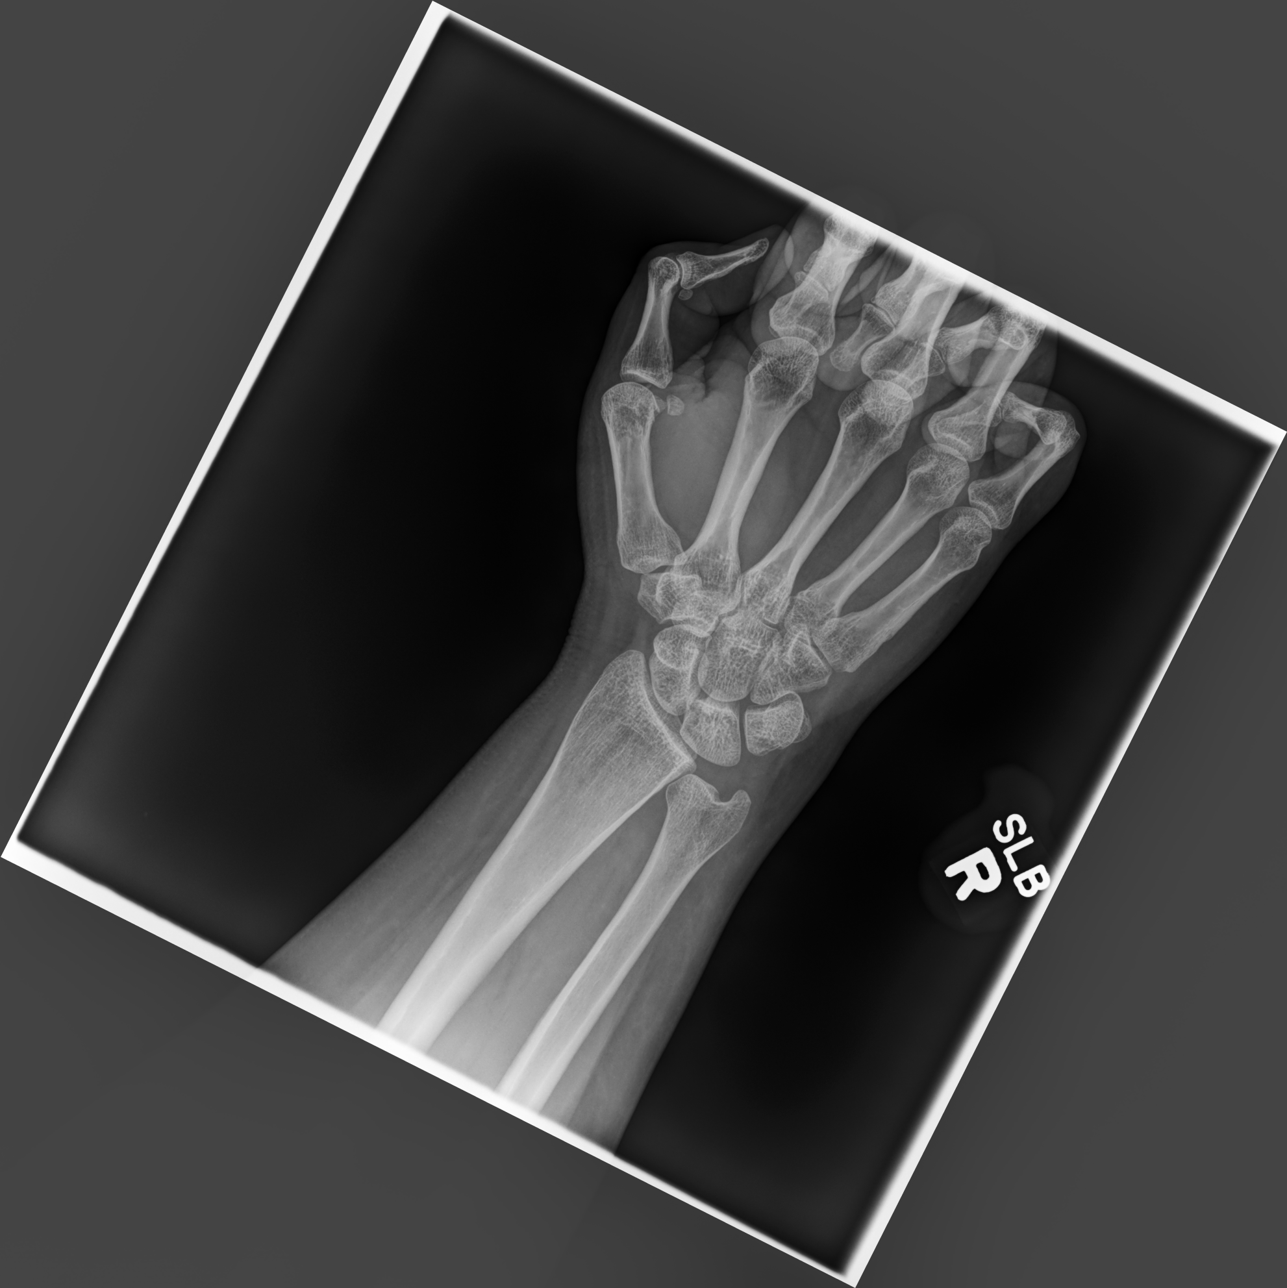

[wrist mlo]
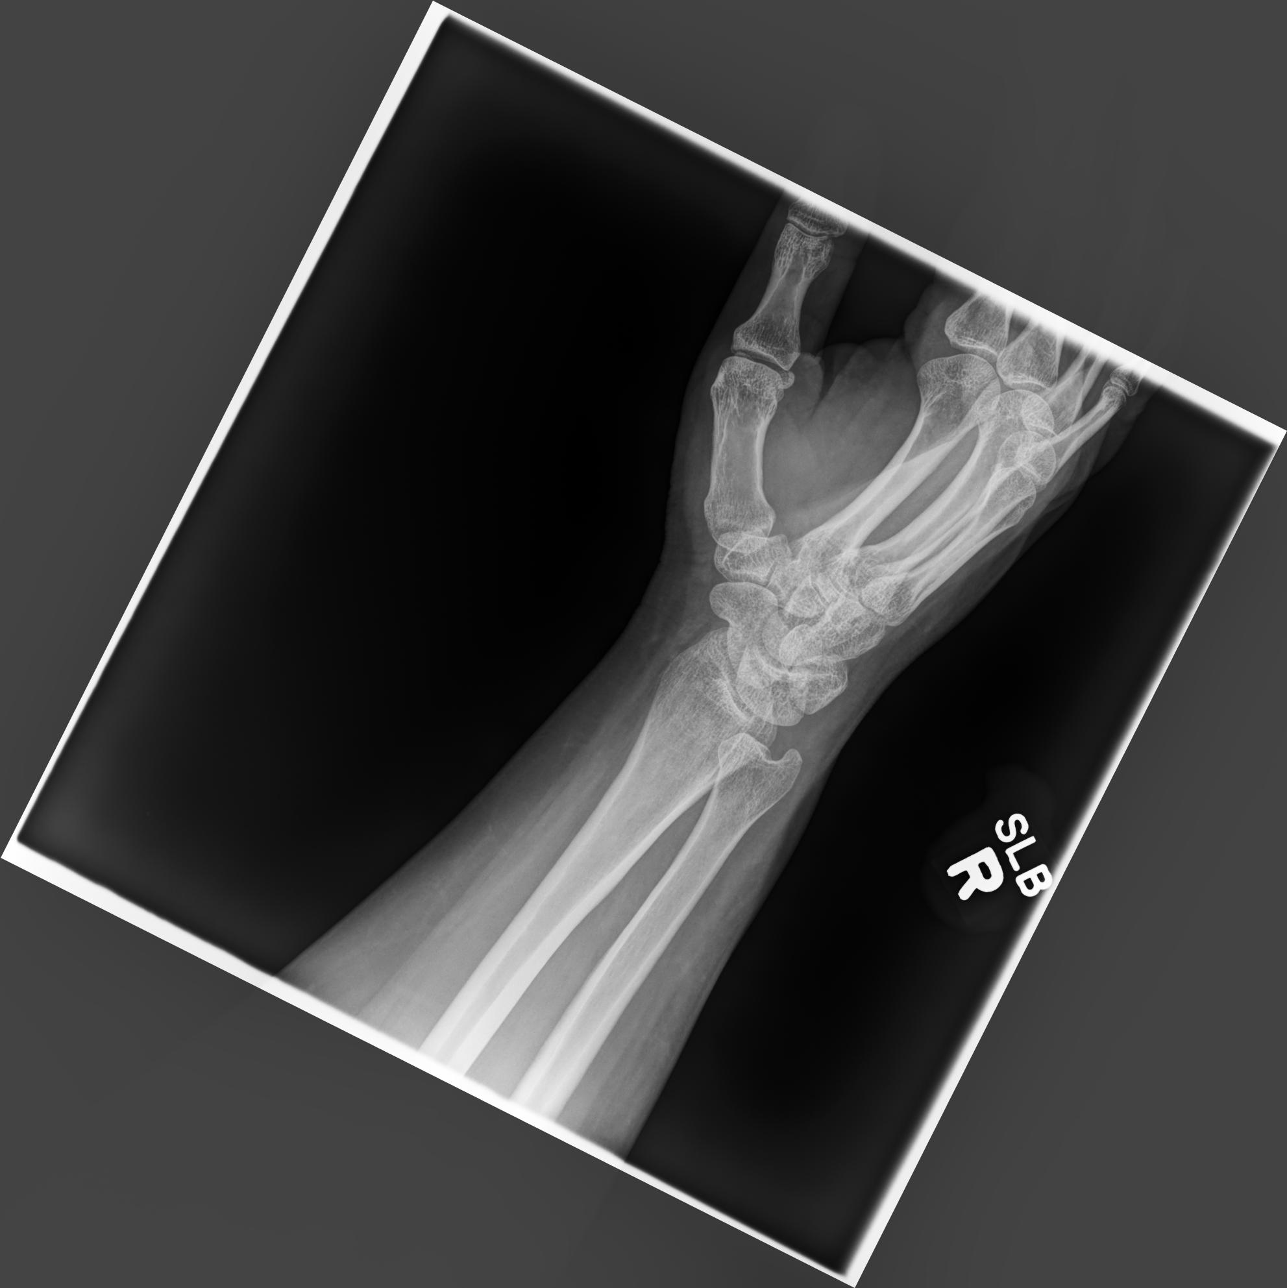

[wrist lat]
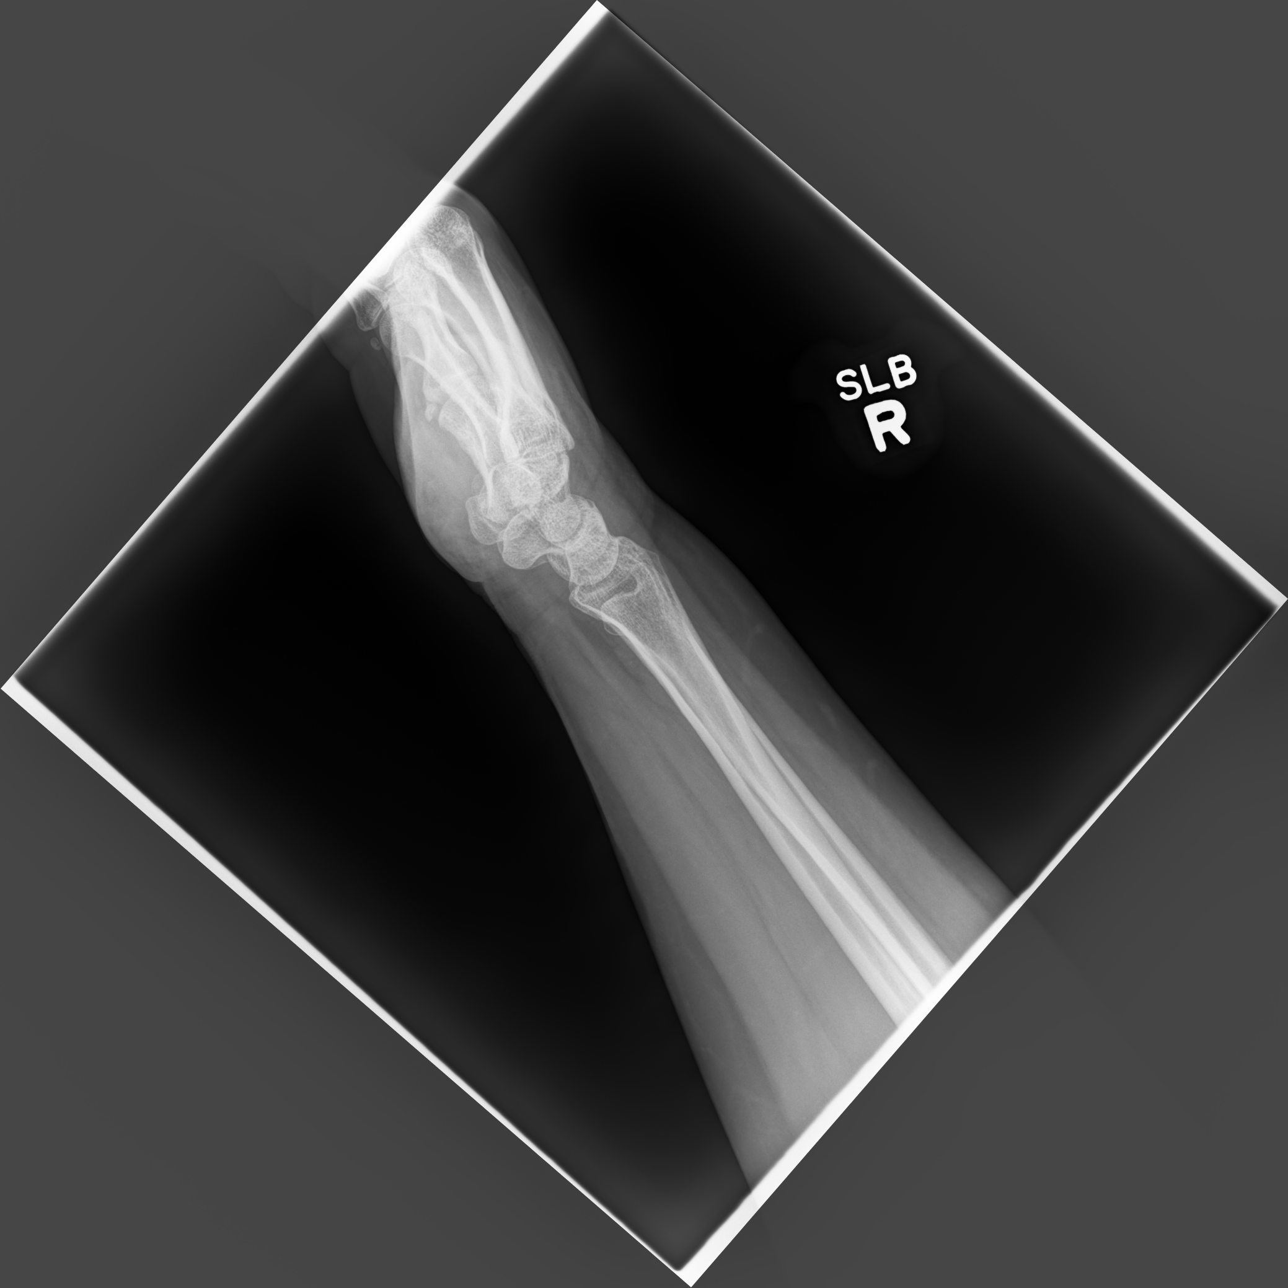

[3 of 3 positions shown; findings below may reference images not displayed]

FINDINGS: The joint spaces are maintained. No acute fracture.
IMPRESSION: No acute bony findings.

## 2024-01-14 ENCOUNTER — Encounter: Payer: Self-pay | Admitting: Radiology

## 2024-03-27 ENCOUNTER — Encounter: Payer: Self-pay | Admitting: Radiology
